# Patient Record
Sex: Male | Born: 1965
Health system: Southern US, Community
[De-identification: ages and names within clinical notes are randomized; demographics above are authoritative.]

## PROBLEM LIST (undated history)

## (undated) DIAGNOSIS — L219 Seborrheic dermatitis, unspecified: Secondary | ICD-10-CM

## (undated) DIAGNOSIS — E889 Metabolic disorder, unspecified: Secondary | ICD-10-CM

## (undated) DIAGNOSIS — K219 Gastro-esophageal reflux disease without esophagitis: Secondary | ICD-10-CM

## (undated) DIAGNOSIS — I82409 Acute embolism and thrombosis of unspecified deep veins of unspecified lower extremity: Secondary | ICD-10-CM

## (undated) DIAGNOSIS — N200 Calculus of kidney: Secondary | ICD-10-CM

## (undated) DIAGNOSIS — R42 Dizziness and giddiness: Secondary | ICD-10-CM

## (undated) DIAGNOSIS — I712 Thoracic aortic aneurysm, without rupture, unspecified: Secondary | ICD-10-CM

## (undated) DIAGNOSIS — R0602 Shortness of breath: Secondary | ICD-10-CM

## (undated) DIAGNOSIS — G479 Sleep disorder, unspecified: Secondary | ICD-10-CM

## (undated) DIAGNOSIS — I714 Abdominal aortic aneurysm, without rupture, unspecified: Secondary | ICD-10-CM

## (undated) DIAGNOSIS — K760 Fatty (change of) liver, not elsewhere classified: Secondary | ICD-10-CM

## (undated) DIAGNOSIS — I10 Essential (primary) hypertension: Secondary | ICD-10-CM

## (undated) DIAGNOSIS — E119 Type 2 diabetes mellitus without complications: Secondary | ICD-10-CM

## (undated) DIAGNOSIS — M549 Dorsalgia, unspecified: Secondary | ICD-10-CM

## (undated) DIAGNOSIS — N529 Male erectile dysfunction, unspecified: Secondary | ICD-10-CM

## (undated) HISTORY — DX: Shortness of breath: R06.02

## (undated) HISTORY — DX: Abdominal aortic aneurysm, without rupture: I71.4

## (undated) HISTORY — DX: Abdominal aortic aneurysm, without rupture, unspecified: I71.40

## (undated) HISTORY — DX: Dizziness and giddiness: R42

## (undated) HISTORY — DX: Gastro-esophageal reflux disease without esophagitis: K21.9

## (undated) HISTORY — DX: Acute embolism and thrombosis of unspecified deep veins of unspecified lower extremity: I82.409

## (undated) HISTORY — DX: Fatty (change of) liver, not elsewhere classified: K76.0

## (undated) HISTORY — DX: Metabolic disorder, unspecified: E88.9

## (undated) HISTORY — DX: Seborrheic dermatitis, unspecified: L21.9

## (undated) HISTORY — PX: DOPPLER ECHOCARDIOGRAPHY: SHX263

## (undated) HISTORY — DX: Male erectile dysfunction, unspecified: N52.9

## (undated) HISTORY — PX: OTHER SURGICAL HISTORY: SHX169

## (undated) HISTORY — DX: Thoracic aortic aneurysm, without rupture: I71.2

## (undated) HISTORY — PX: ANKLE SURGERY: SHX546

## (undated) HISTORY — DX: Thoracic aortic aneurysm, without rupture, unspecified: I71.20

---

## 1999-09-10 ENCOUNTER — Ambulatory Visit: Admission: RE | Admit: 1999-09-10 | Discharge: 1999-09-10 | Payer: Self-pay | Admitting: Family Medicine

## 2000-09-07 ENCOUNTER — Encounter: Payer: Self-pay | Admitting: Family Medicine

## 2000-09-07 ENCOUNTER — Encounter: Admission: RE | Admit: 2000-09-07 | Discharge: 2000-09-07 | Payer: Self-pay | Admitting: Family Medicine

## 2001-04-28 ENCOUNTER — Encounter: Payer: Self-pay | Admitting: Orthopedic Surgery

## 2001-04-28 ENCOUNTER — Ambulatory Visit (HOSPITAL_COMMUNITY): Admission: RE | Admit: 2001-04-28 | Discharge: 2001-04-28 | Payer: Self-pay | Admitting: Orthopedic Surgery

## 2001-07-26 ENCOUNTER — Encounter: Payer: Self-pay | Admitting: Urology

## 2001-07-26 ENCOUNTER — Encounter: Admission: RE | Admit: 2001-07-26 | Discharge: 2001-07-26 | Payer: Self-pay | Admitting: Urology

## 2001-07-27 ENCOUNTER — Encounter: Payer: Self-pay | Admitting: Urology

## 2001-07-27 ENCOUNTER — Ambulatory Visit (HOSPITAL_COMMUNITY): Admission: RE | Admit: 2001-07-27 | Discharge: 2001-07-27 | Payer: Self-pay | Admitting: Urology

## 2001-08-03 ENCOUNTER — Encounter: Payer: Self-pay | Admitting: Urology

## 2001-08-03 ENCOUNTER — Encounter: Admission: RE | Admit: 2001-08-03 | Discharge: 2001-08-03 | Payer: Self-pay | Admitting: Urology

## 2001-09-28 ENCOUNTER — Ambulatory Visit (HOSPITAL_COMMUNITY): Admission: RE | Admit: 2001-09-28 | Discharge: 2001-09-28 | Payer: Self-pay | Admitting: Gastroenterology

## 2003-05-25 ENCOUNTER — Emergency Department (HOSPITAL_COMMUNITY): Admission: EM | Admit: 2003-05-25 | Discharge: 2003-05-25 | Payer: Self-pay | Admitting: Emergency Medicine

## 2004-12-29 HISTORY — PX: OTHER SURGICAL HISTORY: SHX169

## 2005-01-20 HISTORY — PX: OTHER SURGICAL HISTORY: SHX169

## 2005-03-15 ENCOUNTER — Emergency Department (HOSPITAL_COMMUNITY): Admission: EM | Admit: 2005-03-15 | Discharge: 2005-03-15 | Payer: Self-pay | Admitting: *Deleted

## 2005-04-23 ENCOUNTER — Ambulatory Visit (HOSPITAL_COMMUNITY): Admission: RE | Admit: 2005-04-23 | Discharge: 2005-04-23 | Payer: Self-pay | Admitting: Urology

## 2005-04-23 ENCOUNTER — Emergency Department (HOSPITAL_COMMUNITY): Admission: EM | Admit: 2005-04-23 | Discharge: 2005-04-23 | Payer: Self-pay | Admitting: Emergency Medicine

## 2005-12-30 ENCOUNTER — Encounter: Payer: Self-pay | Admitting: Urology

## 2008-06-08 ENCOUNTER — Emergency Department (HOSPITAL_COMMUNITY): Admission: EM | Admit: 2008-06-08 | Discharge: 2008-06-08 | Payer: Self-pay | Admitting: Emergency Medicine

## 2008-06-13 HISTORY — PX: NM MYOCAR PERF EJECTION FRACTION: HXRAD630

## 2008-11-26 ENCOUNTER — Ambulatory Visit: Payer: Self-pay | Admitting: Vascular Surgery

## 2009-01-22 ENCOUNTER — Encounter: Admission: RE | Admit: 2009-01-22 | Discharge: 2009-01-22 | Payer: Self-pay | Admitting: Family Medicine

## 2009-01-29 ENCOUNTER — Encounter: Admission: RE | Admit: 2009-01-29 | Discharge: 2009-01-29 | Payer: Self-pay | Admitting: Family Medicine

## 2009-03-25 ENCOUNTER — Ambulatory Visit: Payer: Self-pay | Admitting: Vascular Surgery

## 2009-06-03 ENCOUNTER — Ambulatory Visit: Payer: Self-pay | Admitting: Vascular Surgery

## 2009-06-25 ENCOUNTER — Ambulatory Visit: Payer: Self-pay | Admitting: Vascular Surgery

## 2009-07-01 ENCOUNTER — Ambulatory Visit: Payer: Self-pay | Admitting: Vascular Surgery

## 2009-07-08 ENCOUNTER — Ambulatory Visit: Payer: Self-pay | Admitting: Vascular Surgery

## 2009-08-01 ENCOUNTER — Ambulatory Visit: Payer: Self-pay | Admitting: Vascular Surgery

## 2009-09-25 ENCOUNTER — Ambulatory Visit (HOSPITAL_BASED_OUTPATIENT_CLINIC_OR_DEPARTMENT_OTHER): Admission: RE | Admit: 2009-09-25 | Discharge: 2009-09-25 | Payer: Self-pay | Admitting: Orthopedic Surgery

## 2010-03-18 ENCOUNTER — Ambulatory Visit: Payer: Self-pay | Admitting: Vascular Surgery

## 2010-04-01 ENCOUNTER — Ambulatory Visit: Payer: Self-pay | Admitting: Vascular Surgery

## 2010-11-16 LAB — POCT HEMOGLOBIN-HEMACUE: Hemoglobin: 14.6 g/dL (ref 13.0–17.0)

## 2010-11-16 LAB — BASIC METABOLIC PANEL
CO2: 30 mEq/L (ref 19–32)
Calcium: 9.6 mg/dL (ref 8.4–10.5)
Chloride: 105 mEq/L (ref 96–112)
GFR calc Af Amer: >60 mL/min (ref >60)
GFR calc non Af Amer: >60 mL/min (ref >60)
Glucose, Bld: 134 mg/dL — ABNORMAL HIGH (ref 70–99)
Potassium: 4.4 mEq/L (ref 3.5–5.1)
Sodium: 140 mEq/L (ref 135–145)

## 2011-01-13 NOTE — Procedures (Signed)
LOWER EXTREMITY VENOUS REFLUX EXAM   INDICATION:  Bilateral lower extremity pain, swelling and varicosities.   EXAM:  Using color-flow imaging and pulse Doppler spectral analysis, the  bilateral common femoral, superficial femoral, popliteal, posterior  tibial, greater and lesser saphenous veins were evaluated.  There  is no  evidence suggesting deep venous insufficiency in the right lower  extremity.  The left demonstrates very mild deep venous insufficiency.   The bilateral saphenofemoral junctions are not competent with reflux  greater than 500 milliseconds. The bilateral great saphenous veins are  not competent with reflux of  greater than 500 milliseconds with the  caliber as described below.   The bilateral proximal short saphenous veins demonstrate competency.   GSV Diameter (used if found to be incompetent only)                                            Right    Left  Proximal Greater Saphenous Vein           1.34 cm  1.08 cm  Proximal-to-mid-thigh                     cm       0.69 cm  Mid thigh                                 0.49 cm  0.47 cm  Mid-distal thigh                          cm       cm  Distal thigh                              0.41 cm  0.39 cm  Knee                                      0.34 cm  0.45 cm   IMPRESSION:  1. Bilateral greater saphenous vein reflux is identified with the      caliber ranging from 0.34 cm to 1.34 cm knee to groin on the right      and 0.45 to 1.08 cm on the left.  2. The bilateral greater saphenous veins are not aneurysmal.  3. The bilateral greater saphenous veins are not tortuous.  4. The deep venous system is competent.        ___________________________________________  Quita Skye. Hart Rochester, M.D.   CJ/MEDQ  D:  06/25/2009  T:  06/26/2009  Job:  [id number]

## 2011-01-13 NOTE — Assessment & Plan Note (Signed)
OFFICE VISIT   Gordon Allen, Gordon Allen  DOB:  21-Apr-1966                                       03/25/2009  WGNFA#:[id number]   Patient returns today for further follow-up regarding his severe venous  insufficiency of the left leg.  He has been wearing long-leg elastic  compression stockings as well as elevating his legs, as much as his work  will permit, and taking ibuprofen and continues to have burning,  throbbing and aching discomfort as well as swelling in the distal leg as  the day progresses.  He works on his feet.  He is a Merchandiser, retail at [employer] and is unable to stop and elevate his legs frequently to relieve  his symptoms.   PHYSICAL EXAMINATION:  On examination, he does have a large nest of  prominent varicosities in the medial calf, caused by the venous  hypertension in his left great saphenous vein, which has refluxed at the  saphenofemoral junction and throughout.  The deep venous system is  unremarkable.   I feel that these symptoms are affecting his daily living and his  ability to work and that we should proceed with laser ablation of his  left great saphenous vein as an initial procedure to be followed by 2  courses of sclerotherapy.  I think this treatment plan will relieve his  symptomatology and improve his ability to work, and we will proceed with  this in the near future when we can achieve precertification.   Quita Skye Hart Rochester, M.D.  Electronically Signed   JDL/MEDQ  D:  03/25/2009  T:  03/26/2009  Job:  [id number]

## 2011-01-13 NOTE — Procedures (Signed)
LOWER EXTREMITY VENOUS REFLUX EXAM   INDICATION:  Left lower extremity varicose veins.   EXAM:  Using color-flow imaging and pulse Doppler spectral analysis, the  left common femoral, superficial femoral, popliteal, posterior tibial,  greater and lesser saphenous veins are evaluated.  There is no evidence  suggesting deep venous insufficiency in the left lower extremity.   The left saphenofemoral junction is not competent.  The left GSV is not  competent with the caliber as described below.   The right and the left proximal short saphenous vein demonstrates  competency.   GSV Diameter (used if found to be incompetent only)                                            Right    Left  Proximal Greater Saphenous Vein           cm       0.70 cm  Proximal-to-mid-thigh                     cm       0.62 cm  Mid thigh                                 cm       0.57 cm  Mid-distal thigh                          cm       0.53 cm  Distal thigh                              cm       0.46 cm  Knee                                      cm       0.49 cm   IMPRESSION:  1. Left greater saphenous vein reflux is identified with the caliber      ranging from 0.70 cm to 0.46 cm knee to groin.  2. The right and left greater saphenous veins are not aneurysmal.  3. The right and left greater saphenous veins are not tortuous.  4. The deep venous system is competent.  5. The right and left lesser saphenous veins are competent.   ___________________________________________  Quita Skye. Hart Rochester, M.D.   AC/MEDQ  D:  11/26/2008  T:  11/26/2008  Job:  [id number]

## 2011-01-13 NOTE — Assessment & Plan Note (Signed)
OFFICE VISIT   Gordon Allen, Gordon Allen  DOB:  [birth year]                                       06/03/2009  ZOXWR#:[id number]   The patient came today for laser ablation of his left great saphenous  vein for painful varicosities and venous insufficiency.  Ultrasound  study appeared that the greater saphenous was incompetent down to the  knee level.  I did enter the greater saphenous on 2 or 3 occasions at  the knee and distal thigh level but had inability to pass the guidewire  proximally.  I did insert the sheath which then caused some discomfort  after which I inserted more tumescent  anesthetic to relieve this.  The proximal greater saphenous vein then  became quite constricted.  I was unable to access the greater saphenous  vein in the proximal thigh.  I felt that the best plan would be wait 3  weeks, repeat a duplex scan of both the right and the left leg since he  is having some symptoms in the right leg as well.  We will look at a  reflux study at that point and see what our options are.  Return in 3  weeks for venous duplex of both legs and to see me.   Quita Skye Hart Rochester, M.D.  Electronically Signed   JDL/MEDQ  D:  06/03/2009  T:  06/04/2009  Job:  [id number]

## 2011-01-13 NOTE — Procedures (Signed)
DUPLEX ULTRASOUND OF ABDOMINAL AORTA   INDICATION:  History of abnormal ultrasound findings, family history of  AAA.   HISTORY:  Diabetes:  No  Cardiac:  No  Hypertension:  Yes  Smoking:  No  Family History:  Father had AAA.  Previous Surgery:  No   DUPLEX EXAM:         AP (cm)                   TRANSVERSE (cm)  Proximal             2.7 cm                    2.8 cm  Mid                  2.6 cm                    2.6 cm  Distal               2.5 cm                    2.7 cm  Right Iliac          1.5 cm                    1.5 cm  Left Iliac           1.5 cm                    1.5 cm   PREVIOUS:  Date:  AP:  TRANSVERSE:   IMPRESSION:  No evidence of aneurysm dilatation or elevated Doppler  velocities noted in the abdominal aorta and bilateral common iliac  arteries.   ___________________________________________  Quita Skye Hart Rochester, M.D.   CH/MEDQ  D:  04/01/2010  T:  04/01/2010  Job:  [id number]

## 2011-01-13 NOTE — Procedures (Signed)
DUPLEX DEEP VENOUS EXAM - LOWER EXTREMITY   INDICATION:  Post 1 week laser of the left great saphenous vein.   HISTORY:  Edema:  Yes.  Trauma/Surgery:  EVLT.  Pain:  Yes.  PE:  No.  Previous DVT:  No.  Anticoagulants:  No.  Other:   DUPLEX EXAM:                CFV   SFV   PopV  PTV    GSV                R  L  R  L  R  L  R   L  R  L  Thrombosis    o  o     o     o      o     +  Spontaneous   +  +     +     +      +     0  Phasic        +  +     +     +      +     0  Augmentation  +  +     +     +      +     0  Compressible  +  +     +     +      +     0  Competent     +  +     +     +      +     0   Legend:  + - yes  o - no  p - partial  D - decreased   IMPRESSION:  No evidence of deep venous thrombosis identified.  Left  great saphenous vein ablated from the saphenofemoral junction to the  distal thigh.    _____________________________  Quita Skye. Hart Rochester, M.D.   CJ/MEDQ  D:  07/08/2009  T:  07/08/2009  Job:  [id number]

## 2011-01-13 NOTE — Assessment & Plan Note (Signed)
OFFICE VISIT   Gordon Allen, Gordon Allen  DOB:  1966/06/23                                       06/25/2009  AVWUJ#:[id number]   The patient returns today for further evaluation after an attempt at  laser ablation on October 4 but was unable to get the guidewire to pass  proximally.  He had repeat venous duplex today and the great saphenous  vein continues to be widely patent with gross reflux up to the junction.  I do think this vein is accessible and if we are unable to cannulate it  percutaneously we can certainly approach it by cutdown.  He continues to  have the aching, throbbing, restless discomfort in his legs throughout  the day and he is on his feet at work despite wearing long-leg elastic  compression stockings.  Right leg has also become more symptomatic  particularly at night where he has a restless, heavy, aching feeling  which worsens during the day.  Today the venous study also shows reflux  on the right side in the great saphenous vein with normal deep system.   Will plan to proceed with laser ablation of his left great saphenous  vein next week and will follow this with sclerotherapy of the left side.  We will then see how the right leg symptoms progress over the next few  months and whether his symptomatology on the left leg is completely  relieved.   Quita Skye Hart Rochester, M.D.  Electronically Signed   JDL/MEDQ  D:  06/25/2009  T:  06/26/2009  Job:  [id number]

## 2011-01-13 NOTE — Assessment & Plan Note (Signed)
OFFICE VISIT   Gordon Allen, BLANK  DOB:  [birth year]                                       07/08/2009  ZOXWR#:[id number]   The patient returns today 1 week post laser ablation of his left great  saphenous vein with no phlebectomies performed.  He has had moderately  severe discomfort in the left thigh from the groin to the mid thigh  where the ablation was performed.  He has noticed significant bruising  in that area.  He has had no pain below the knee.  He has had  significant problems with the elastic compression stocking staying up,  and this is irritating the thigh.  He has taken his ibuprofen 3 tablets  3 times a day (200 mg) as instructed.  He denies any chest pain, dyspnea  on exertion, neurologic symptoms or other associated symptoms.  He has  had no distal edema in the left ankle.   PHYSICAL EXAMINATION:  Blood pressure 122/78, heart rate 78,  respirations 14.  Generally, he is alert and oriented x3.  Neck:  Supple, 3+ carotid pulses palpable.  Lower extremity exam reveals mild  tenderness along the course of the great saphenous vein from the  saphenofemoral junction to the mid thigh with mild to moderate bruising  which is resolving.  He has persistent varicosities in reticular veins  below the knee over the great saphenous system, which will be scheduled  for sclerotherapy in the near future.  He has no distal edema.   Venous duplex exam was ordered by me today and reviewed, and he has no  evidence of deep venous obstruction and total ablation of the left great  saphenous vein from the saphenofemoral junction to the distal thigh.  In  general, I think he is making reasonable progress.  He will discontinue  the stocking and wrap his thigh with an Ace, and we will schedule  sclerotherapy with Marisue Ivan in the near future.   Quita Skye Hart Rochester, M.D.  Electronically Signed   JDL/MEDQ  D:  07/08/2009  T:  07/09/2009  Job:  [id number]

## 2011-01-13 NOTE — Consult Note (Signed)
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  He first noticed these 3  to 4 years ago and they have become increasingly noticeable and  increasingly symptoms.  He has had no deep vein thrombosis,  thrombophlebitis, pulmonary embolism, or clotting problems, but does  have some mild swelling as the day progresses and the symptoms that were  aching and throbbing seem to worsen.  This is not relieved by elevation.  He does not take pain medication.  He does not wear elastic compression  stockings.   PAST MEDICAL HISTORY:  1. Hypertension.  2. Negative for diabetes, coronary artery disease, COPD, or stroke.   PREVIOUS SURGERIES:  Arthrodesis of the right ankle.   FAMILY HISTORY:  Positive for hypertension in mother and father.  Negative for coronary artery disease, diabetes, and stroke.   SOCIAL HISTORY:  He is single and works as a Merchandiser, retail at [employer].  Does not use tobacco and drinks occasional alcohol.   REVIEW OF SYSTEMS:  Negative for claudication.  No GI, GU, cardiac, or  pulmonary symptoms.   ALLERGIES:  None known.   MEDICATIONS:  Lisinopril 20 mg daily.   PHYSICAL EXAM:  Blood pressure 138/88, heart rate is 80, respirations  16.  Generally, he is a middle-aged male in no apparent distress.  Alert  and oriented x3.  Neck is supple, 2+ carotid pulses palpable.  No bruits  are audible.  Neurologic exam is normal.  No palpable adenopathy in the  neck.  Chest is clear to auscultation.  Cardiovascular exam is a regular  rhythm, no murmurs.  Abdomen soft and nontender with no masses.  He has  3+ femoral, popliteal, dorsalis pedis, and posterior tibial pulses  bilaterally.   Left leg has severe venous insufficiency below the knee  associated with the great saphenous system.  There is a prominent varix,  which feeds some reticular and spider veins in the mid calf posteriorly  associated with the great saphenous system.  There is mild edema in the  ankle distally.  No hyperpigmentation or ulceration is noted on the  left.  The right leg is free of varicosities.   Venous duplex exam revealed gross reflux in the entire left greater  saphenous vein from the saphenofemoral junction to the knee and even  more distally.  The deep system is normal.  Right leg is unremarkable.   He does have symptomatic venous insufficiency and we will treat him with  elastic compression stockings (20 mm - 30 mm gradient, long leg) as well  as elevation of the leg as much as his job will permit, and ibuprofen on  a regular basis for pain control.  He will return in 3 months to see if  he is having symptomatic relief, and if not, he would be a good  candidate for laser ablation of the left greater saphenous vein on the  first procedure and as a secondary procedure, 2 sessions of  sclerotherapy for these varicosities.   Quita Skye Hart Rochester, M.D.  Electronically Signed  JDL/MEDQ  D:  11/26/2008  T:  11/26/2008  Job:  [id number]   cc:   Windy Fast L. Earlene Plater, M.D.

## 2011-01-13 NOTE — Assessment & Plan Note (Signed)
OFFICE VISIT   HULBERT, BRANSCOME  DOB:  Nov 21, 1965                                       03/18/2010  ZOXWR#:[id number]   The patient came to the office today concerned about venous disease  because he has developed restless legs.  He had these symptoms prior  to his left leg ablation procedure which was performed in November 2010.  He has not developed any recurrent varicosities or had any aching,  throbbing, or burning discomfort or change in the edema of his left leg.  He states that at night his legs are constantly needing to be moved  around because of restlessness.  He also states that he is concerned  about an abdominal aortic aneurysm since his father had that and he did  have an ultrasound performed in Dr. Truett Perna office in 2009 which was  slightly abnormal.   PHYSICAL EXAMINATION:  On this exam today, his blood pressure 145/80,  heart rate 63, respirations 20.  His lower extremity exam reveals no  evidence of recurrent varicosities.  He has no edema in either lower  extremity.  He has 3+ femoral, popliteal, dorsalis pedis pulses, well-  perfused lower extremities and hyperpigmentation ulcerations noted.   I did not see any evidence of recurrent venous disease.  I discussed  this with the patient.  We will schedule him for an outpatient venous  duplex exam to get a baseline size of his aneurysm because of his family  history and followup will depend on the results of that study.     Quita Skye Hart Rochester, M.D.  Electronically Signed   JDL/MEDQ  D:  03/18/2010  T:  03/18/2010  Job:  [id number]

## 2011-06-02 LAB — DIFFERENTIAL
Basophils Relative: 0
Eosinophils Relative: 1
Lymphocytes Relative: 27
Lymphs Abs: 1.7
Monocytes Relative: 7

## 2011-06-02 LAB — CBC
HCT: 43.2
Hemoglobin: 14.6
MCV: 92
RDW: 12.9
WBC: 6.3

## 2011-06-02 LAB — POCT I-STAT, CHEM 8
BUN: 11
HCT: 43
Hemoglobin: 14.6

## 2012-12-15 ENCOUNTER — Encounter (HOSPITAL_COMMUNITY): Payer: Self-pay | Admitting: Emergency Medicine

## 2012-12-15 ENCOUNTER — Emergency Department (HOSPITAL_COMMUNITY)
Admission: EM | Admit: 2012-12-15 | Discharge: 2012-12-15 | Disposition: A | Discharge status: Home / Self Care | Payer: Managed Care, Other (non HMO) | Attending: Emergency Medicine | Admitting: Emergency Medicine

## 2012-12-15 ENCOUNTER — Ambulatory Visit
Admission: RE | Admit: 2012-12-15 | Discharge: 2012-12-15 | Disposition: A | Discharge status: Home / Self Care | Payer: Managed Care, Other (non HMO) | Source: Ambulatory Visit | Attending: Physician Assistant | Admitting: Physician Assistant

## 2012-12-15 ENCOUNTER — Other Ambulatory Visit: Payer: Self-pay | Admitting: Physician Assistant

## 2012-12-15 DIAGNOSIS — I82409 Acute embolism and thrombosis of unspecified deep veins of unspecified lower extremity: Secondary | ICD-10-CM | POA: Insufficient documentation

## 2012-12-15 DIAGNOSIS — Z8669 Personal history of other diseases of the nervous system and sense organs: Secondary | ICD-10-CM | POA: Insufficient documentation

## 2012-12-15 DIAGNOSIS — L740 Miliaria rubra: Secondary | ICD-10-CM

## 2012-12-15 DIAGNOSIS — M7989 Other specified soft tissue disorders: Secondary | ICD-10-CM | POA: Insufficient documentation

## 2012-12-15 DIAGNOSIS — I83891 Varicose veins of right lower extremities with other complications: Secondary | ICD-10-CM

## 2012-12-15 DIAGNOSIS — Z7901 Long term (current) use of anticoagulants: Secondary | ICD-10-CM | POA: Insufficient documentation

## 2012-12-15 DIAGNOSIS — I82401 Acute embolism and thrombosis of unspecified deep veins of right lower extremity: Secondary | ICD-10-CM

## 2012-12-15 DIAGNOSIS — I10 Essential (primary) hypertension: Secondary | ICD-10-CM | POA: Insufficient documentation

## 2012-12-15 DIAGNOSIS — E119 Type 2 diabetes mellitus without complications: Secondary | ICD-10-CM | POA: Insufficient documentation

## 2012-12-15 DIAGNOSIS — Z87442 Personal history of urinary calculi: Secondary | ICD-10-CM | POA: Insufficient documentation

## 2012-12-15 DIAGNOSIS — Z8739 Personal history of other diseases of the musculoskeletal system and connective tissue: Secondary | ICD-10-CM | POA: Insufficient documentation

## 2012-12-15 HISTORY — DX: Sleep disorder, unspecified: G47.9

## 2012-12-15 HISTORY — DX: Essential (primary) hypertension: I10

## 2012-12-15 HISTORY — DX: Type 2 diabetes mellitus without complications: E11.9

## 2012-12-15 HISTORY — DX: Calculus of kidney: N20.0

## 2012-12-15 HISTORY — DX: Dorsalgia, unspecified: M54.9

## 2012-12-15 MED ORDER — RIVAROXABAN 15 MG PO TABS: 15.0000 mg | ORAL_TABLET | Freq: Two times a day (BID) | ORAL | Status: DC

## 2012-12-15 MED ORDER — RIVAROXABAN 20 MG PO TABS: 20.0000 mg | ORAL_TABLET | Freq: Every day | ORAL | Status: DC

## 2012-12-15 NOTE — ED Notes (Signed)
Onset one week ago light pink rash right lower extremity increasing in size and color light pink to red. Pain right lower extremity pain worsening overtime. Pain currently 4/10 at rest. Outpatient ultrasound today positive for DVT RLE.

## 2012-12-16 ENCOUNTER — Telehealth: Payer: Self-pay | Admitting: Vascular Surgery

## 2012-12-16 NOTE — Telephone Encounter (Signed)
Pt called to discuss his recent DVT diagnosis.  Pt seen in ER yesterday with right leg DVT.  Started on Xarelto.  Still has pain in leg and swelling.  He has no shortness of breath.  Findings and treatment plan prescribed by the ER reviewed.  Pt reassured.  Advised to come to ER if shortness of breath or coughing or hemoptysis or if worsened swelling.  Avoid strenuous activities.  Fabienne Bruns, MD [facility] Office: [phone] Pager: [phone]

## 2012-12-19 ENCOUNTER — Telehealth: Payer: Self-pay | Admitting: *Deleted

## 2012-12-19 NOTE — Telephone Encounter (Signed)
Gordon Allen called to report that he will be followed by Dr. Clarene Duke in 4 weeks for bloodwork concerning his DVT. He understands the importance of following up on this; he was placed on Xarelto by the ED. Dr. Darrick Penna spoke to him by phone on 12-16-12.

## 2012-12-20 NOTE — ED Provider Notes (Signed)
History    46yM with RLE pain and swelling. Onset about a week ago. Progressively worsening. Denies trauma. NO fever or chills. Progressively worsening and worse with standing/ambulation. No hx of similar symptoms. Referred to ED after positive [place] for DVT. Denies CP, palpitations, SOB or lightheadedness. No hx of blood clot.  CSN: [id number]  Arrival date & time 12/15/12  1134   First MD Initiated Contact with Patient 12/15/12 1144      Chief Complaint  Patient presents with  . Leg Pain    (Consider location/radiation/quality/duration/timing/severity/associated sxs/prior treatment) HPI  Past Medical History  Diagnosis Date  . Back pain   . Hypertension   . Diabetes mellitus without complication   . Sleep difficulties   . Kidney stone     Past Surgical History  Procedure Laterality Date  . Ankle surgery      No family history on file.  History  Substance Use Topics  . Smoking status: Never Smoker   . Smokeless tobacco: Not on file  . Alcohol Use: No      Review of Systems  All systems reviewed and negative, other than as noted in HPI.   Allergies  Review of patient's allergies indicates no known allergies.  Home Medications   Current Outpatient Rx  Name  Route  Sig  Dispense  Refill  . Rivaroxaban (XARELTO) 15 MG TABS tablet   Oral   Take 1 tablet (15 mg total) by mouth 2 (two) times daily.   42 tablet   0     For 21 days then 20mg  daily.   . Rivaroxaban (XARELTO) 20 MG TABS   Oral   Take 1 tablet (20 mg total) by mouth daily.   60 tablet   0     Start on day 22.     BP 149/102  Pulse 86  Temp(Src) 98 F (36.7 C) (Oral)  Resp 18  Ht 5\' 11"  (1.803 m)  Wt 235 lb (106.595 kg)  BMI 32.79 kg/m2  SpO2 99%  Physical Exam  Nursing note and vitals reviewed. Constitutional: He appears well-developed and well-nourished. No distress.  HENT:  Head: Normocephalic and atraumatic.  Eyes: Conjunctivae are normal. Right eye exhibits no discharge.  Left eye exhibits no discharge.  Neck: Neck supple.  Cardiovascular: Normal rate, regular rhythm and normal heart sounds.  Exam reveals no gallop and no friction rub.   No murmur heard. Pulmonary/Chest: Effort normal and breath sounds normal. No respiratory distress.  Abdominal: Soft. He exhibits no distension. There is no tenderness.  Musculoskeletal: He exhibits no edema and no tenderness.  Flat splotchy erythematous rash to R shin. RLE swollen as compared to L. Calf tender. No cords palpated. Positive Homan's. Good DP and PT pulses.  Neurological: He is alert.  Skin: Skin is warm and dry.  Psychiatric: He has a normal mood and affect. His behavior is normal. Thought content normal.    ED Course  Procedures (including critical care time)  Labs Reviewed - No data to display No results found.   1. DVT (deep venous thrombosis), right       MDM  46yM with atraumatic RLE pain. [place] with evidence of DVT. No clinical evidence of PE. Discussed tx options. Pt requesting xarelto. Has "good" insurance. Information packer and scripts provided. Possible precipitated by recent care trip. Discussed the need to follow-up with PCP.        Raeford Razor, MD 12/20/12 [phone]

## 2012-12-22 ENCOUNTER — Emergency Department (HOSPITAL_COMMUNITY)
Admission: EM | Admit: 2012-12-22 | Discharge: 2012-12-22 | Disposition: A | Discharge status: Home / Self Care | Payer: Managed Care, Other (non HMO) | Attending: Emergency Medicine | Admitting: Emergency Medicine

## 2012-12-22 ENCOUNTER — Emergency Department (HOSPITAL_COMMUNITY): Payer: Managed Care, Other (non HMO)

## 2012-12-22 ENCOUNTER — Encounter (HOSPITAL_COMMUNITY): Payer: Self-pay | Admitting: Emergency Medicine

## 2012-12-22 DIAGNOSIS — I10 Essential (primary) hypertension: Secondary | ICD-10-CM | POA: Insufficient documentation

## 2012-12-22 DIAGNOSIS — IMO0001 Reserved for inherently not codable concepts without codable children: Secondary | ICD-10-CM | POA: Insufficient documentation

## 2012-12-22 DIAGNOSIS — R739 Hyperglycemia, unspecified: Secondary | ICD-10-CM

## 2012-12-22 DIAGNOSIS — R0609 Other forms of dyspnea: Secondary | ICD-10-CM

## 2012-12-22 DIAGNOSIS — Z87442 Personal history of urinary calculi: Secondary | ICD-10-CM | POA: Insufficient documentation

## 2012-12-22 DIAGNOSIS — R0600 Dyspnea, unspecified: Secondary | ICD-10-CM

## 2012-12-22 DIAGNOSIS — M7989 Other specified soft tissue disorders: Secondary | ICD-10-CM | POA: Insufficient documentation

## 2012-12-22 DIAGNOSIS — R42 Dizziness and giddiness: Secondary | ICD-10-CM | POA: Insufficient documentation

## 2012-12-22 DIAGNOSIS — Z8739 Personal history of other diseases of the musculoskeletal system and connective tissue: Secondary | ICD-10-CM | POA: Insufficient documentation

## 2012-12-22 DIAGNOSIS — Z79899 Other long term (current) drug therapy: Secondary | ICD-10-CM | POA: Insufficient documentation

## 2012-12-22 DIAGNOSIS — R109 Unspecified abdominal pain: Secondary | ICD-10-CM | POA: Insufficient documentation

## 2012-12-22 DIAGNOSIS — R0602 Shortness of breath: Secondary | ICD-10-CM | POA: Insufficient documentation

## 2012-12-22 DIAGNOSIS — E119 Type 2 diabetes mellitus without complications: Secondary | ICD-10-CM | POA: Insufficient documentation

## 2012-12-22 DIAGNOSIS — Z8669 Personal history of other diseases of the nervous system and sense organs: Secondary | ICD-10-CM | POA: Insufficient documentation

## 2012-12-22 HISTORY — DX: Acute embolism and thrombosis of unspecified deep veins of unspecified lower extremity: I82.409

## 2012-12-22 LAB — URINALYSIS, ROUTINE W REFLEX MICROSCOPIC
Hgb urine dipstick: NEGATIVE
Ketones, ur: NEGATIVE mg/dL
Specific Gravity, Urine: 1.024 (ref 1.005–1.030)
pH: 5.5 (ref 5.0–8.0)

## 2012-12-22 LAB — COMPREHENSIVE METABOLIC PANEL
Alkaline Phosphatase: 75 U/L (ref 39–117)
BUN: 11 mg/dL (ref 6–23)
CO2: 25 mEq/L (ref 19–32)
Chloride: 98 mEq/L (ref 96–112)
Creatinine, Ser: 0.8 mg/dL (ref 0.50–1.35)
GFR calc non Af Amer: >90 mL/min (ref >90)
Sodium: 137 mEq/L (ref 135–145)
Total Protein: 8.4 g/dL — ABNORMAL HIGH (ref 6.0–8.3)

## 2012-12-22 LAB — CBC WITH DIFFERENTIAL/PLATELET
Basophils Relative: 1 % (ref 0–1)
Eosinophils Absolute: 0 10*3/uL (ref 0.0–0.7)
HCT: 45 % (ref 39.0–52.0)
Hemoglobin: 15.7 g/dL (ref 13.0–17.0)
Lymphs Abs: 1.7 10*3/uL (ref 0.7–4.0)
MCH: 30.9 pg (ref 26.0–34.0)
MCV: 88.6 fL (ref 78.0–100.0)
Monocytes Absolute: 0.6 10*3/uL (ref 0.1–1.0)
Monocytes Relative: 7 % (ref 3–12)
Neutro Abs: 5.9 10*3/uL (ref 1.7–7.7)

## 2012-12-22 LAB — TROPONIN I: Troponin I: <0.3 ng/mL (ref <0.30)

## 2012-12-22 MED ORDER — SODIUM CHLORIDE 0.9 % IV BOLUS (SEPSIS)
1000.0000 mL | Freq: Once | INTRAVENOUS | Status: AC
  Administered 2012-12-22: 1000 mL via INTRAVENOUS

## 2012-12-22 MED ORDER — IOHEXOL 350 MG/ML SOLN
100.0000 mL | Freq: Once | INTRAVENOUS | Status: AC | PRN
  Administered 2012-12-22: 100 mL via INTRAVENOUS

## 2012-12-22 NOTE — ED Notes (Signed)
Patient given urinal and advised needed sample.

## 2012-12-22 NOTE — ED Provider Notes (Signed)
History     CSN: [id number]  Arrival date & time 12/22/12  1416   First MD Initiated Contact with Patient 12/22/12 1505      Chief Complaint  Patient presents with  . DVT    (Consider location/radiation/quality/duration/timing/severity/associated sxs/prior treatment) HPI Pt seen 1 week ago and diagnosed with RLE DVT. Started on xarelto. Pt states he has been compliant with meds. Has had > 1 week of increasing DOE. States he get winded climbing 1 flight of stairs. No dyspnea at rest. No fever, chills, chest or back pain. Pt states he drove to [place] from [place] in mid march. No recent surgeries. No family hx of PE/DVT.  Past Medical History  Diagnosis Date  . Back pain   . Hypertension   . Diabetes mellitus without complication   . Sleep difficulties   . Kidney stone   . DVT (deep venous thrombosis)     Past Surgical History  Procedure Laterality Date  . Ankle surgery      No family history on file.  History  Substance Use Topics  . Smoking status: Never Smoker   . Smokeless tobacco: Not on file  . Alcohol Use: No      Review of Systems  Constitutional: Negative for fever and chills.  Respiratory: Positive for shortness of breath. Negative for cough and wheezing.   Cardiovascular: Positive for leg swelling. Negative for chest pain and palpitations.  Gastrointestinal: Negative for nausea, vomiting and abdominal pain.  Musculoskeletal: Positive for myalgias. Negative for back pain.  Skin: Negative for wound.  Neurological: Positive for dizziness and light-headedness. Negative for syncope, weakness and numbness.  All other systems reviewed and are negative.    Allergies  Review of patient's allergies indicates no known allergies.  Home Medications   Current Outpatient Rx  Name  Route  Sig  Dispense  Refill  . diazepam (VALIUM) 5 MG tablet   Oral   Take 5 mg by mouth at bedtime.         [name] HYDROcodone-acetaminophen (NORCO) 10-325 MG per tablet   Oral  Take 1 tablet by mouth every 6 (six) hours as needed for pain.         [name] lisinopril-hydrochlorothiazide (PRINZIDE,ZESTORETIC) 20-12.5 MG per tablet   Oral   Take 1 tablet by mouth daily.         . metFORMIN (GLUCOPHAGE) 500 MG tablet   Oral   Take 500 mg by mouth 2 (two) times daily with a meal.         . polyethylene glycol (MIRALAX / GLYCOLAX) packet   Oral   Take 17 g by mouth daily as needed (for constipation).         . Rivaroxaban (XARELTO) 15 MG TABS tablet   Oral   Take 15 mg by mouth 2 (two) times daily. 21 day course of therapy started 12/15/12.           BP 131/96  Pulse 124  Temp(Src) 98.4 F (36.9 C) (Oral)  Resp 26  SpO2 97%  Physical Exam  Nursing note and vitals reviewed. Constitutional: He is oriented to person, place, and time. He appears well-developed and well-nourished. No distress.  HENT:  Head: Normocephalic and atraumatic.  Mouth/Throat: Oropharynx is clear and moist.  Eyes: EOM are normal. Pupils are equal, round, and reactive to light.  Neck: Normal range of motion. Neck supple.  Cardiovascular: Normal rate and regular rhythm.   Pulmonary/Chest: Effort normal and breath sounds normal. No respiratory distress.  He has no wheezes. He has no rales. He exhibits no tenderness.  Abdominal: Soft. Bowel sounds are normal. He exhibits no distension and no mass. There is no tenderness. There is no rebound and no guarding.  Musculoskeletal: Normal range of motion. He exhibits tenderness (R calf tenderness and firmness. Compression stocking in place. ). He exhibits no edema.  Neurological: He is alert and oriented to person, place, and time.  Moves all ext without deficit  Skin: Skin is warm and dry. No rash noted. No erythema.  Psychiatric: He has a normal mood and affect. His behavior is normal.    ED Course  Procedures (including critical care time)  Labs Reviewed  COMPREHENSIVE METABOLIC PANEL - Abnormal; Notable for the following:     Glucose, Bld 213 (*)    Total Protein 8.4 (*)    All other components within normal limits  PROTIME-INR - Abnormal; Notable for the following:    Prothrombin Time 19.0 (*)    INR 1.65 (*)    All other components within normal limits  URINALYSIS, ROUTINE W REFLEX MICROSCOPIC - Abnormal; Notable for the following:    Glucose, UA 100 (*)    All other components within normal limits  APTT - Abnormal; Notable for the following:    aPTT 58 (*)    All other components within normal limits  CBC WITH DIFFERENTIAL  TROPONIN I   Ct Angio Chest Pe W/cm &/or Wo Cm  12/22/2012  *RADIOLOGY REPORT*  Clinical Data: Shortness of breath.  Recent DVT.  CT ANGIOGRAPHY CHEST  Technique:  Multidetector CT imaging of the chest using the standard protocol during bolus administration of intravenous contrast. Multiplanar reconstructed images including MIPs were obtained and reviewed to evaluate the vascular anatomy.  Contrast: OMNIPAQUE IOHEXOL 350 MG/ML SOLN  Comparison: CT 08/10/2008  Findings: No filling defects in the pulmonary arteries to suggest pulmonary emboli. Heart is normal size. Aorta is normal caliber. No mediastinal, hilar, or axillary adenopathy.  Visualized thyroid and chest wall soft tissues unremarkable. Lungs are clear.  No focal airspace opacities or suspicious nodules.  No effusions. Imaging into the upper abdomen shows no acute findings.  No acute or focal bony abnormality.  IMPRESSION: No evidence of pulmonary embolus.  No acute findings.   Original Report Authenticated By: Charlett Nose, M.D.      1. Dyspnea on exertion   2. Hyperglycemia      Date: 12/22/2012  Rate:113  Rhythm: sinus tachycardia  QRS Axis: normal  Intervals: normal  ST/T Wave abnormalities: normal  Conduction Disutrbances:none  Narrative Interpretation:   Old EKG Reviewed: none available    MDM   HR improved with IVF's. Workup otherwise negative. Suspect dehydration and deconditioning cause for pt's symptoms.  Pt advised to f/u with PMD for ongoing symptoms and to return for worsening symptoms. Voiced understanding.        Loren Racer, MD 12/22/12 [phone]

## 2012-12-22 NOTE — Progress Notes (Signed)
Pt confirms pcp is kevin little [facility] updated

## 2012-12-22 NOTE — ED Notes (Signed)
Pt currently being treated for DVT to right leg. Pt now having increase in shortness of breath x 2 days.

## 2013-02-20 ENCOUNTER — Other Ambulatory Visit: Payer: Self-pay | Admitting: *Deleted

## 2013-02-20 DIAGNOSIS — I83893 Varicose veins of bilateral lower extremities with other complications: Secondary | ICD-10-CM

## 2013-02-22 ENCOUNTER — Other Ambulatory Visit: Payer: Self-pay

## 2013-02-22 ENCOUNTER — Encounter (INDEPENDENT_AMBULATORY_CARE_PROVIDER_SITE_OTHER): Payer: Managed Care, Other (non HMO) | Admitting: Vascular Surgery

## 2013-02-22 DIAGNOSIS — I83893 Varicose veins of bilateral lower extremities with other complications: Secondary | ICD-10-CM

## 2013-02-24 ENCOUNTER — Encounter: Payer: Self-pay | Admitting: Vascular Surgery

## 2013-02-27 ENCOUNTER — Ambulatory Visit (INDEPENDENT_AMBULATORY_CARE_PROVIDER_SITE_OTHER): Payer: Managed Care, Other (non HMO) | Admitting: Vascular Surgery

## 2013-02-27 ENCOUNTER — Encounter: Payer: Self-pay | Admitting: Vascular Surgery

## 2013-02-27 ENCOUNTER — Telehealth: Payer: Self-pay | Admitting: *Deleted

## 2013-02-27 VITALS — BP 151/91 | HR 56 | Resp 18 | Ht 71.0 in | Wt 235.4 lb

## 2013-02-27 DIAGNOSIS — I82401 Acute embolism and thrombosis of unspecified deep veins of right lower extremity: Secondary | ICD-10-CM

## 2013-02-27 DIAGNOSIS — I82409 Acute embolism and thrombosis of unspecified deep veins of unspecified lower extremity: Secondary | ICD-10-CM | POA: Insufficient documentation

## 2013-02-27 DIAGNOSIS — I83893 Varicose veins of bilateral lower extremities with other complications: Secondary | ICD-10-CM | POA: Insufficient documentation

## 2013-02-27 NOTE — Telephone Encounter (Signed)
Pt was seen today to review his duplex. He had some more questions about his DVT. I tried to explain what Dr. Hart Rochester had told him during the visit regarding his blood thinner, Aspirin, how to treat symptoms, what to look out for, etc. Pt was reassured.

## 2013-02-27 NOTE — Progress Notes (Signed)
Subjective:     Patient ID: Gordon Allen, male   DOB: 1965/09/06, 47 y.o.   MRN: [id number]  HPI this 47 year old male is known to me having undergone laser ablation of the left great saphenous vein and 2010 varicose veins. He's done well until April of this year when he developed a DVT in the right superficial femoral and popliteal veins. He has taken [place] since that time prescribed by Dr. Catha Gosselin. He denies any swelling in the leg but does state that his legs hurt after working. He is not wearing elastic compression stockings on a regular basis.   Past Medical History  Diagnosis Date  . Back pain   . Hypertension   . Diabetes mellitus without complication   . Sleep difficulties   . Kidney stone   . DVT (deep venous thrombosis)     History  Substance Use Topics  . Smoking status: Never Smoker   . Smokeless tobacco: Not on file  . Alcohol Use: No    No family history on file.  Allergies  Allergen Reactions  . Diclofenac Sodium     Increased gas  . Gabapentin     Severe diarrhea  . Tramadol     nausea    Current outpatient prescriptions:atorvastatin (LIPITOR) 20 MG tablet, Take 20 mg by mouth daily., Disp: , Rfl: ;  diazepam (VALIUM) 5 MG tablet, Take 5 mg by mouth at bedtime., Disp: , Rfl: ;  lisinopril-hydrochlorothiazide (PRINZIDE,ZESTORETIC) 20-12.5 MG per tablet, Take 1 tablet by mouth daily., Disp: , Rfl: ;  metFORMIN (GLUCOPHAGE) 500 MG tablet, Take 500 mg by mouth 2 (two) times daily with a meal., Disp: , Rfl:  pantoprazole (PROTONIX) 20 MG tablet, Take 20 mg by mouth 2 (two) times daily., Disp: , Rfl: ;  Pregabalin (LYRICA PO), Take by mouth daily., Disp: , Rfl: ;  Rivaroxaban (XARELTO) 15 MG TABS tablet, Take 20 mg by mouth daily. 21 day course of therapy started 12/15/12., Disp: , Rfl: ;  HYDROcodone-acetaminophen (NORCO) 10-325 MG per tablet, Take 1 tablet by mouth every 6 (six) hours as needed for pain., Disp: , Rfl:  polyethylene glycol (MIRALAX /  GLYCOLAX) packet, Take 17 g by mouth daily as needed (for constipation)., Disp: , Rfl:   BP 151/91  Pulse 56  Resp 18  Ht 5\' 11"  (1.803 m)  Wt 235 lb 6.4 oz (106.777 kg)  BMI 32.85 kg/m2  Body mass index is 32.85 kg/(m^2).           Review of Systems denies chest pain, dyspnea on exertion, PND, orthopnea, hemoptysis, claudication. Does complain of hip and knee pain. All other systems negative and complete review of systems    Objective:   Physical Exam blood pressure 151/91 heart rate 56 respirations 18 Gen.-alert and oriented x3 in no apparent distress HEENT normal for age Lungs no rhonchi or wheezing Cardiovascular regular rhythm no murmurs carotid pulses 3+ palpable no bruits audible Abdomen soft nontender no palpable masses Musculoskeletal free of  major deformities Skin clear -no rashes Neurologic normal Lower extremities 3+ femoral and dorsalis pedis pulses palpable bilaterally with no edema  Today I ordered a venous duplex exam of both legs which are reviewed and interpreted. There is a chronic DVT in the right leg involving the distal superficial femoral and popliteal veins with the rest of the right lower extremity venous system being patent. Left leg continues to have closure of the left great saphenous system and no DVT.  Assessment:     History of DVT the right leg superficial femoral and popliteal vein 2 months ago    Plan:     Would recommend continuing Xeralto for a total of 6 months and then switch to aspirin only -daily If patient has any edema he should be wearing bilateral lower extremity compression stockings and elevate the foot of the bed at night Further followup by Dr. Catha Gosselin

## 2013-03-21 ENCOUNTER — Ambulatory Visit: Payer: Managed Care, Other (non HMO) | Admitting: Vascular Surgery

## 2013-04-24 ENCOUNTER — Ambulatory Visit: Payer: Managed Care, Other (non HMO) | Admitting: Vascular Surgery

## 2013-05-30 DIAGNOSIS — D6859 Other primary thrombophilia: Secondary | ICD-10-CM | POA: Insufficient documentation

## 2013-07-10 ENCOUNTER — Other Ambulatory Visit: Payer: Self-pay | Admitting: *Deleted

## 2013-07-10 DIAGNOSIS — I803 Phlebitis and thrombophlebitis of lower extremities, unspecified: Secondary | ICD-10-CM

## 2013-07-31 ENCOUNTER — Encounter: Payer: Self-pay | Admitting: Vascular Surgery

## 2013-08-01 ENCOUNTER — Ambulatory Visit (INDEPENDENT_AMBULATORY_CARE_PROVIDER_SITE_OTHER): Payer: Managed Care, Other (non HMO) | Admitting: Vascular Surgery

## 2013-08-01 ENCOUNTER — Encounter: Payer: Self-pay | Admitting: Vascular Surgery

## 2013-08-01 ENCOUNTER — Encounter (HOSPITAL_COMMUNITY): Payer: Managed Care, Other (non HMO)

## 2013-08-01 ENCOUNTER — Ambulatory Visit (HOSPITAL_COMMUNITY)
Admission: RE | Admit: 2013-08-01 | Discharge: 2013-08-01 | Disposition: A | Discharge status: Home / Self Care | Payer: Managed Care, Other (non HMO) | Source: Ambulatory Visit | Attending: Vascular Surgery | Admitting: Vascular Surgery

## 2013-08-01 VITALS — BP 139/91 | HR 66 | Resp 16 | Ht 71.0 in | Wt 230.0 lb

## 2013-08-01 DIAGNOSIS — I82401 Acute embolism and thrombosis of unspecified deep veins of right lower extremity: Secondary | ICD-10-CM

## 2013-08-01 DIAGNOSIS — I82409 Acute embolism and thrombosis of unspecified deep veins of unspecified lower extremity: Secondary | ICD-10-CM

## 2013-08-01 DIAGNOSIS — I803 Phlebitis and thrombophlebitis of lower extremities, unspecified: Secondary | ICD-10-CM | POA: Insufficient documentation

## 2013-08-01 NOTE — Progress Notes (Signed)
Subjective:     Patient ID: Gordon Allen, male   DOB: [birth year], 47 y.o.   MRN: [id number]  HPI this 47 year old male returns today for followup regarding his chronic DVT in the right leg. He was found to have chronic occlusion in the distal superficial femoral vein and proximal popliteal vein 6 months ago. He also has reflux in the right great saphenous vein. He has been on Xeralto. He has been having occasional aching discomfort in the right leg and is concerned about more clots being present. He has no history of swelling in the left leg and has had previous laser ablation of the left great saphenous vein. He has a family history of abdominal aortic aneurysm-father-and was concerned about result of a duplex scan we did here in 2011. I related that information to him which revealed he has no evidence of abdominal aortic aneurysm.  Past Medical History  Diagnosis Date  . Back pain   . Hypertension   . Diabetes mellitus without complication   . Sleep difficulties   . Kidney stone   . DVT (deep venous thrombosis)     History  Substance Use Topics  . Smoking status: Never Smoker   . Smokeless tobacco: Not on file  . Alcohol Use: No    No family history on file.  Allergies  Allergen Reactions  . Diclofenac Sodium     Increased gas  . Gabapentin     Severe diarrhea  . Tramadol     nausea    Current outpatient prescriptions:atorvastatin (LIPITOR) 20 MG tablet, Take 20 mg by mouth daily., Disp: , Rfl: ;  diazepam (VALIUM) 5 MG tablet, Take 5 mg by mouth at bedtime., Disp: , Rfl: ;  HYDROcodone-acetaminophen (NORCO) 10-325 MG per tablet, Take 1 tablet by mouth every 6 (six) hours as needed for pain., Disp: , Rfl: ;  lisinopril-hydrochlorothiazide (PRINZIDE,ZESTORETIC) 20-12.5 MG per tablet, Take 1 tablet by mouth daily., Disp: , Rfl:  metFORMIN (GLUCOPHAGE) 500 MG tablet, Take 500 mg by mouth 2 (two) times daily with a meal., Disp: , Rfl: ;  pantoprazole (PROTONIX) 20 MG tablet,  Take 20 mg by mouth 2 (two) times daily., Disp: , Rfl: ;  polyethylene glycol (MIRALAX / GLYCOLAX) packet, Take 17 g by mouth daily as needed (for constipation)., Disp: , Rfl: ;  Pregabalin (LYRICA PO), Take by mouth daily., Disp: , Rfl:  Rivaroxaban (XARELTO) 15 MG TABS tablet, Take 20 mg by mouth daily. 21 day course of therapy started 12/15/12., Disp: , Rfl:   BP 139/91  Pulse 66  Resp 16  Ht 5\' 11"  (1.803 m)  Wt 230 lb (104.327 kg)  BMI 32.09 kg/m2  Body mass index is 32.09 kg/(m^2).          Review of Systems denies chest pain or dyspnea on exertion    Objective:   Physical Exam BP 139/91  Pulse 66  Resp 16  Ht 5\' 11"  (1.803 m)  Wt 230 lb (104.327 kg)  BMI 32.09 kg/m2  General well-developed well-nourished male no apparent stress alert and oriented x3 Lungs no rhonchi or wheezing Right leg with no significant distal edema. 3+ femoral and dorsalis pedis pulse palpable.  Today I ordered a duplex scan of the right leg which are reviewed and interpreted. There continues to be reflux in the right great saphenous vein. There is no new DVT. He does have chronic DVT in the mid to distal right superficial femoral and proximal popliteal vein which is unchanged.  Assessment:     Chronic DVT right leg unchanged from 6 months ago-currently on Xeralto Patient sees Dr. Shawnee Knapp at [facility] for clotting problems No evidence of abdominal aortic aneurysm on duplex scan in 20 oh 11     Plan:     Would begin taking daily aspirin only and discontinue Xeralto per Dr. Lacie Draft recommendation Return to see [place] on when necessary basis

## 2014-03-29 ENCOUNTER — Emergency Department (HOSPITAL_COMMUNITY)
Admission: EM | Admit: 2014-03-29 | Discharge: 2014-03-29 | Disposition: A | Discharge status: Home / Self Care | Payer: Worker's Compensation | Attending: Emergency Medicine | Admitting: Emergency Medicine

## 2014-03-29 ENCOUNTER — Encounter (HOSPITAL_COMMUNITY): Payer: Self-pay | Admitting: Emergency Medicine

## 2014-03-29 DIAGNOSIS — Z79899 Other long term (current) drug therapy: Secondary | ICD-10-CM | POA: Insufficient documentation

## 2014-03-29 DIAGNOSIS — S0191XA Laceration without foreign body of unspecified part of head, initial encounter: Secondary | ICD-10-CM

## 2014-03-29 DIAGNOSIS — Z791 Long term (current) use of non-steroidal anti-inflammatories (NSAID): Secondary | ICD-10-CM | POA: Insufficient documentation

## 2014-03-29 DIAGNOSIS — W208XXA Other cause of strike by thrown, projected or falling object, initial encounter: Secondary | ICD-10-CM | POA: Insufficient documentation

## 2014-03-29 DIAGNOSIS — Y9289 Other specified places as the place of occurrence of the external cause: Secondary | ICD-10-CM | POA: Insufficient documentation

## 2014-03-29 DIAGNOSIS — S0990XA Unspecified injury of head, initial encounter: Secondary | ICD-10-CM

## 2014-03-29 DIAGNOSIS — Y990 Civilian activity done for income or pay: Secondary | ICD-10-CM | POA: Insufficient documentation

## 2014-03-29 DIAGNOSIS — E119 Type 2 diabetes mellitus without complications: Secondary | ICD-10-CM | POA: Insufficient documentation

## 2014-03-29 DIAGNOSIS — Z87442 Personal history of urinary calculi: Secondary | ICD-10-CM | POA: Insufficient documentation

## 2014-03-29 DIAGNOSIS — S0100XA Unspecified open wound of scalp, initial encounter: Secondary | ICD-10-CM | POA: Insufficient documentation

## 2014-03-29 DIAGNOSIS — Z7982 Long term (current) use of aspirin: Secondary | ICD-10-CM | POA: Insufficient documentation

## 2014-03-29 DIAGNOSIS — I10 Essential (primary) hypertension: Secondary | ICD-10-CM | POA: Insufficient documentation

## 2014-03-29 DIAGNOSIS — Z86718 Personal history of other venous thrombosis and embolism: Secondary | ICD-10-CM | POA: Insufficient documentation

## 2014-03-29 MED ORDER — ACETAMINOPHEN 500 MG PO TABS
1000.0000 mg | ORAL_TABLET | Freq: Once | ORAL | Status: AC
  Administered 2014-03-29: 1000 mg via ORAL
  Filled 2014-03-29: qty 2

## 2014-03-29 NOTE — ED Notes (Signed)
Pt states while at work this am board fell from about 12-14 feet hitting pt in top of the head. Laceration noted. Bleeding controlled.

## 2014-03-29 NOTE — Discharge Instructions (Signed)
Keep wound clean and dry. Take tylenol or ibuprofen as needed for pain. Refer to attached documents for more information. Return to the ED with worsening or concerning symptoms. Follow up with your PCP in 1 week for staple removal or return to the ED for staple removal.

## 2014-03-29 NOTE — ED Provider Notes (Signed)
Medical screening examination/treatment/procedure(s) were performed by non-physician practitioner and as supervising physician I was immediately available for consultation/collaboration.   EKG Interpretation None       Kalman Drape, MD 03/29/14 [phone]

## 2014-03-29 NOTE — ED Notes (Signed)
Patient here for workman's comp and boss came to ED with patient Patient is very adamant that NO medical history or any issues r/t patient care be discussed with his boss Informed patient that his boss can wait out in the ED waiting room, that he did not have to have him present at bedside

## 2014-03-29 NOTE — ED Provider Notes (Signed)
CSN: [id number]     Arrival date & time 03/29/14  0529 History   First MD Initiated Contact with Patient 03/29/14 [phone]     Chief Complaint  Patient presents with  . Head Injury     (Consider location/radiation/quality/duration/timing/severity/associated sxs/prior Treatment) HPI Comments: Patient is a 48 year old male who presents from work with a head injury that occurred prior to arrival. Patient reports a wooden board falling 12-14 feet and hitting him on the top of the head. Patient reports immediate onset of throbbing and severe pain that started after the incident. Patient denies any LOC. He reports associated wound with copious bleeding. Patient held pressure on the wound to control bleeding. Patient denies any other symptoms or other injuries. No aggravating/alleviatin factors.    Past Medical History  Diagnosis Date  . Back pain   . Hypertension   . Diabetes mellitus without complication   . Sleep difficulties   . Kidney stone   . DVT (deep venous thrombosis)    Past Surgical History  Procedure Laterality Date  . Ankle surgery     No family history on file. History  Substance Use Topics  . Smoking status: Never Smoker   . Smokeless tobacco: Not on file  . Alcohol Use: No    Review of Systems  Constitutional: Negative for fever, chills and fatigue.  HENT: Negative for trouble swallowing.   Eyes: Negative for visual disturbance.  Respiratory: Negative for shortness of breath.   Cardiovascular: Negative for chest pain and palpitations.  Gastrointestinal: Negative for nausea, vomiting, abdominal pain and diarrhea.  Genitourinary: Negative for dysuria and difficulty urinating.  Musculoskeletal: Negative for arthralgias and neck pain.  Skin: Positive for wound. Negative for color change.  Neurological: Positive for headaches. Negative for dizziness and weakness.  Psychiatric/Behavioral: Negative for dysphoric mood.      Allergies  Diclofenac sodium; Gabapentin;  and Tramadol  Home Medications   Prior to Admission medications   Medication Sig Start Date End Date Taking? Authorizing Provider  aspirin EC 81 MG tablet Take 81 mg by mouth daily.   Yes Historical Provider, MD  atorvastatin (LIPITOR) 20 MG tablet Take 20 mg by mouth daily.   Yes Historical Provider, MD  celecoxib (CELEBREX) 200 MG capsule Take 200 mg by mouth 2 (two) times daily.   Yes Historical Provider, MD  lisinopril-hydrochlorothiazide (PRINZIDE,ZESTORETIC) 20-12.5 MG per tablet Take 1 tablet by mouth daily.   Yes Historical Provider, MD  metFORMIN (GLUCOPHAGE) 1000 MG tablet Take 1,000 mg by mouth 2 (two) times daily with a meal.   Yes Historical Provider, MD  oxymorphone (OPANA) 5 MG tablet Take 5 mg by mouth every 6 (six) hours as needed for pain.   Yes Historical Provider, MD  pantoprazole (PROTONIX) 20 MG tablet Take 20 mg by mouth daily.    Yes Historical Provider, MD  polyethylene glycol (MIRALAX / GLYCOLAX) packet Take 17 g by mouth daily as needed (for constipation).   Yes Historical Provider, MD   BP 158/93  Pulse 102  Temp(Src) 98 F (36.7 C) (Oral)  Resp 18  Ht 5\' 11"  (1.803 m)  Wt 230 lb (104.327 kg)  BMI 32.09 kg/m2  SpO2 100% Physical Exam  Nursing note and vitals reviewed. Constitutional: He is oriented to person, place, and time. He appears well-developed and well-nourished. No distress.  HENT:  Head: Normocephalic and atraumatic.  4cm laceration of parietal scalp with controlled bleeding with underlying hematoma. The area is tender to palpation.   Eyes:  Conjunctivae and EOM are normal. Pupils are equal, round, and reactive to light.  Neck: Normal range of motion.  Cardiovascular: Normal rate and regular rhythm.  Exam reveals no gallop and no friction rub.   No murmur heard. Pulmonary/Chest: Effort normal and breath sounds normal. He has no wheezes. He has no rales. He exhibits no tenderness.  Musculoskeletal: Normal range of motion.  Neurological: He is  alert and oriented to person, place, and time. Coordination normal.  Speech is goal-oriented. Moves limbs without ataxia.   Skin: Skin is warm and dry.  See HENT  Psychiatric: He has a normal mood and affect. His behavior is normal.    ED Course  Procedures (including critical care time) Labs Review Labs Reviewed - No data to display  LACERATION REPAIR Performed by: Alvina Chou Authorized by: Alvina Chou Consent: Verbal consent obtained. Risks and benefits: risks, benefits and alternatives were discussed Consent given by: patient Patient identity confirmed: provided demographic data Prepped and Draped in normal sterile fashion Wound explored  Laceration Location: parietal scalp  Laceration Length: 4 cm  No Foreign Bodies seen or palpated  Anesthesia: local infiltration  Local anesthetic: lidocaine 1% with epinephrine  Anesthetic total: 2 ml  Irrigation method: syringe Amount of cleaning: standard  Skin closure: staples  Number of staples: 3  Technique: n/a  Patient tolerance: Patient tolerated the procedure well with no immediate complications.   Imaging Review No results found.   EKG Interpretation None      MDM   Final diagnoses:  Laceration of head, initial encounter  Head injury, initial encounter    7:06 AM Patient's laceration repaired without difficulty. Vitals stable and patient afebrile. Patient does not need a head CT at this time. Patient had no LOC or skull fracture noted clinically. Patient has no neuro deficits. Patient instructed to be aware of any worsening or concerning symptoms and to return to the ED if needed. Patient instructed to follow up with PCP in 5-7 days for staple removal.    Alvina Chou, PA-C 03/29/14 [phone]

## 2014-03-29 NOTE — ED Notes (Signed)
ED PA at bedside

## 2014-04-05 ENCOUNTER — Encounter (HOSPITAL_COMMUNITY): Payer: Self-pay | Admitting: Emergency Medicine

## 2014-04-05 ENCOUNTER — Emergency Department (HOSPITAL_COMMUNITY)
Admission: EM | Admit: 2014-04-05 | Discharge: 2014-04-05 | Disposition: A | Discharge status: Home / Self Care | Payer: Worker's Compensation | Attending: Emergency Medicine | Admitting: Emergency Medicine

## 2014-04-05 ENCOUNTER — Emergency Department (HOSPITAL_COMMUNITY): Payer: Worker's Compensation

## 2014-04-05 DIAGNOSIS — E119 Type 2 diabetes mellitus without complications: Secondary | ICD-10-CM | POA: Insufficient documentation

## 2014-04-05 DIAGNOSIS — Z86718 Personal history of other venous thrombosis and embolism: Secondary | ICD-10-CM | POA: Insufficient documentation

## 2014-04-05 DIAGNOSIS — Z87442 Personal history of urinary calculi: Secondary | ICD-10-CM | POA: Insufficient documentation

## 2014-04-05 DIAGNOSIS — F0781 Postconcussional syndrome: Secondary | ICD-10-CM | POA: Insufficient documentation

## 2014-04-05 DIAGNOSIS — R42 Dizziness and giddiness: Secondary | ICD-10-CM | POA: Insufficient documentation

## 2014-04-05 DIAGNOSIS — Z4802 Encounter for removal of sutures: Secondary | ICD-10-CM | POA: Insufficient documentation

## 2014-04-05 DIAGNOSIS — I10 Essential (primary) hypertension: Secondary | ICD-10-CM | POA: Insufficient documentation

## 2014-04-05 DIAGNOSIS — R51 Headache: Secondary | ICD-10-CM | POA: Insufficient documentation

## 2014-04-05 DIAGNOSIS — Z79899 Other long term (current) drug therapy: Secondary | ICD-10-CM | POA: Insufficient documentation

## 2014-04-05 DIAGNOSIS — Z7982 Long term (current) use of aspirin: Secondary | ICD-10-CM | POA: Insufficient documentation

## 2014-04-05 MED ORDER — ACETAMINOPHEN 500 MG PO TABS: 500.0000 mg | ORAL_TABLET | Freq: Four times a day (QID) | ORAL | Status: DC | PRN

## 2014-04-05 NOTE — Discharge Instructions (Signed)
Concussion  A concussion, or closed-head injury, is a brain injury caused by a direct blow to the head or by a quick and sudden movement (jolt) of the head or neck. Concussions are usually not life-threatening. Even so, the effects of a concussion can be serious. If you have had a concussion before, you are more likely to experience concussion-like symptoms after a direct blow to the head.   CAUSES  · Direct blow to the head, such as from running into another player during a soccer game, being hit in a fight, or hitting your head on a hard surface.  · A jolt of the head or neck that causes the brain to move back and forth inside the skull, such as in a car crash.  SIGNS AND SYMPTOMS  The signs of a concussion can be hard to notice. Early on, they may be missed by you, family members, and health care providers. You may look fine but act or feel differently.  Symptoms are usually temporary, but they may last for days, weeks, or even longer. Some symptoms may appear right away while others may not show up for hours or days. Every head injury is different. Symptoms include:  · Mild to moderate headaches that will not go away.  · A feeling of pressure inside your head.  · Having more trouble than usual:  ¨ Learning or remembering things you have heard.  ¨ Answering questions.  ¨ Paying attention or concentrating.  ¨ Organizing daily tasks.  ¨ Making decisions and solving problems.  · Slowness in thinking, acting or reacting, speaking, or reading.  · Getting lost or being easily confused.  · Feeling tired all the time or lacking energy (fatigued).  · Feeling drowsy.  · Sleep disturbances.  ¨ Sleeping more than usual.  ¨ Sleeping less than usual.  ¨ Trouble falling asleep.  ¨ Trouble sleeping (insomnia).  · Loss of balance or feeling lightheaded or dizzy.  · Nausea or vomiting.  · Numbness or tingling.  · Increased sensitivity to:  ¨ Sounds.  ¨ Lights.  ¨ Distractions.  · Vision problems or eyes that tire  easily.  · Diminished sense of taste or smell.  · Ringing in the ears.  · Mood changes such as feeling sad or anxious.  · Becoming easily irritated or angry for little or no reason.  · Lack of motivation.  · Seeing or hearing things other people do not see or hear (hallucinations).  DIAGNOSIS  Your health care provider can usually diagnose a concussion based on a description of your injury and symptoms. He or she will ask whether you passed out (lost consciousness) and whether you are having trouble remembering events that happened right before and during your injury.  Your evaluation might include:  · A brain scan to look for signs of injury to the brain. Even if the test shows no injury, you may still have a concussion.  · Blood tests to be sure other problems are not present.  TREATMENT  · Concussions are usually treated in an emergency department, in urgent care, or at a clinic. You may need to stay in the hospital overnight for further treatment.  · Tell your health care provider if you are taking any medicines, including prescription medicines, over-the-counter medicines, and natural remedies. Some medicines, such as blood thinners (anticoagulants) and aspirin, may increase the chance of complications. Also tell your health care provider whether you have had alcohol or are taking illegal drugs. This information   may affect treatment.  · Your health care provider will send you home with important instructions to follow.  · How fast you will recover from a concussion depends on many factors. These factors include how severe your concussion is, what part of your brain was injured, your age, and how healthy you were before the concussion.  · Most people with mild injuries recover fully. Recovery can take time. In general, recovery is slower in older persons. Also, persons who have had a concussion in the past or have other medical problems may find that it takes longer to recover from their current injury.  HOME  CARE INSTRUCTIONS  General Instructions  · Carefully follow the directions your health care provider gave you.  · Only take over-the-counter or prescription medicines for pain, discomfort, or fever as directed by your health care provider.  · Take only those medicines that your health care provider has approved.  · Do not drink alcohol until your health care provider says you are well enough to do so. Alcohol and certain other drugs may slow your recovery and can put you at risk of further injury.  · If it is harder than usual to remember things, write them down.  · If you are easily distracted, try to do one thing at a time. For example, do not try to watch TV while fixing dinner.  · Talk with family members or close friends when making important decisions.  · Keep all follow-up appointments. Repeated evaluation of your symptoms is recommended for your recovery.  · Watch your symptoms and tell others to do the same. Complications sometimes occur after a concussion. Older adults with a brain injury may have a higher risk of serious complications, such as a blood clot on the brain.  · Tell your teachers, school nurse, school counselor, coach, athletic trainer, or work manager about your injury, symptoms, and restrictions. Tell them about what you can or cannot do. They should watch for:  ¨ Increased problems with attention or concentration.  ¨ Increased difficulty remembering or learning new information.  ¨ Increased time needed to complete tasks or assignments.  ¨ Increased irritability or decreased ability to cope with stress.  ¨ Increased symptoms.  · Rest. Rest helps the brain to heal. Make sure you:  ¨ Get plenty of sleep at night. Avoid staying up late at night.  ¨ Keep the same bedtime hours on weekends and weekdays.  ¨ Rest during the day. Take daytime naps or rest breaks when you feel tired.  · Limit activities that require a lot of thought or concentration. These include:  ¨ Doing homework or job-related  work.  ¨ Watching TV.  ¨ Working on the computer.  · Avoid any situation where there is potential for another head injury (football, hockey, soccer, basketball, martial arts, downhill snow sports and horseback riding). Your condition will get worse every time you experience a concussion. You should avoid these activities until you are evaluated by the appropriate follow-up health care providers.  Returning To Your Regular Activities  You will need to return to your normal activities slowly, not all at once. You must give your body and brain enough time for recovery.  · Do not return to sports or other athletic activities until your health care provider tells you it is safe to do so.  · Ask your health care provider when you can drive, ride a bicycle, or operate heavy machinery. Your ability to react may be slower after a   brain injury. Never do these activities if you are dizzy.  · Ask your health care provider about when you can return to work or school.  Preventing Another Concussion  It is very important to avoid another brain injury, especially before you have recovered. In rare cases, another injury can lead to permanent brain damage, brain swelling, or death. The risk of this is greatest during the first 7-10 days after a head injury. Avoid injuries by:  · Wearing a seat belt when riding in a car.  · Drinking alcohol only in moderation.  · Wearing a helmet when biking, skiing, skateboarding, skating, or doing similar activities.  · Avoiding activities that could lead to a second concussion, such as contact or recreational sports, until your health care provider says it is okay.  · Taking safety measures in your home.  ¨ Remove clutter and tripping hazards from floors and stairways.  ¨ Use grab bars in bathrooms and handrails by stairs.  ¨ Place non-slip mats on floors and in bathtubs.  ¨ Improve lighting in dim areas.  SEEK MEDICAL CARE IF:  · You have increased problems paying attention or  concentrating.  · You have increased difficulty remembering or learning new information.  · You need more time to complete tasks or assignments than before.  · You have increased irritability or decreased ability to cope with stress.  · You have more symptoms than before.  Seek medical care if you have any of the following symptoms for more than 2 weeks after your injury:  · Lasting (chronic) headaches.  · Dizziness or balance problems.  · Nausea.  · Vision problems.  · Increased sensitivity to noise or light.  · Depression or mood swings.  · Anxiety or irritability.  · Memory problems.  · Difficulty concentrating or paying attention.  · Sleep problems.  · Feeling tired all the time.  SEEK IMMEDIATE MEDICAL CARE IF:  · You have severe or worsening headaches. These may be a sign of a blood clot in the brain.  · You have weakness (even if only in one hand, leg, or part of the face).  · You have numbness.  · You have decreased coordination.  · You vomit repeatedly.  · You have increased sleepiness.  · One pupil is larger than the other.  · You have convulsions.  · You have slurred speech.  · You have increased confusion. This may be a sign of a blood clot in the brain.  · You have increased restlessness, agitation, or irritability.  · You are unable to recognize people or places.  · You have neck pain.  · It is difficult to wake you up.  · You have unusual behavior changes.  · You lose consciousness.  MAKE SURE YOU:  · Understand these instructions.  · Will watch your condition.  · Will get help right away if you are not doing well or get worse.  Document Released: 11/07/2003 Document Revised: 08/22/2013 Document Reviewed: 03/09/2013  [employer]® Patient Information ©2015 [employer]. This information is not intended to replace advice given to you by your health care provider. Make sure you discuss any questions you have with your health care provider.

## 2014-04-05 NOTE — ED Notes (Signed)
Pt was here Thursday being treated for head trauma, he received staples, its a workers comp case and needs documentation about his exam on Thursday. He complains of headaches and dizziness since the accident. When he lays down he gets nauseated. He also states that the insurance company that [employer] deals with says that his discharge instructions aren't clear enough

## 2014-04-05 NOTE — ED Provider Notes (Signed)
CSN: [id number]     Arrival date & time 04/05/14  2000 History   First MD Initiated Contact with Patient 04/05/14 2129     Chief Complaint  Patient presents with  . Headache     (Consider location/radiation/quality/duration/timing/severity/associated sxs/prior Treatment) HPI  This 64 are old with history of back pain, hypertension, DVT who presents with headache and dizziness. Patient reports he was hit in head by 2 x 6 one week he had at work. He sustained a laceration to the head. He was seen and evaluated and his laceration was repaired. Since that time he reports intermittent headache which is sharp and bitemporal. Currently he is pain-free. He denies any vision changes, weakness, or numbness. He does report dizziness which feels like "being off balance." He states that he tried to followup with a provider that was bleeding to his Workmen's Comp. but because he is still having symptoms he was referred to the ER. He denies any injury. He denies any other symptoms. Does take a baby aspirin daily.  At baseline, patient does Opana and Celebrex for chronic pain.  Past Medical History  Diagnosis Date  . Back pain   . Hypertension   . Diabetes mellitus without complication   . Sleep difficulties   . Kidney stone   . DVT (deep venous thrombosis)    Past Surgical History  Procedure Laterality Date  . Ankle surgery     History reviewed. No pertinent family history. History  Substance Use Topics  . Smoking status: Never Smoker   . Smokeless tobacco: Not on file  . Alcohol Use: No    Review of Systems  Constitutional: Negative.  Negative for fever.  Eyes: Negative for photophobia and visual disturbance.  Respiratory: Negative.  Negative for chest tightness and shortness of breath.   Cardiovascular: Negative.  Negative for chest pain.  Gastrointestinal: Negative.  Negative for nausea and abdominal pain.  Genitourinary: Negative.  Negative for dysuria.  Musculoskeletal: Negative for  neck pain.  Neurological: Positive for dizziness, light-headedness and headaches.  All other systems reviewed and are negative.     Allergies  Diclofenac sodium; Gabapentin; and Tramadol  Home Medications   Prior to Admission medications   Medication Sig Start Date End Date Taking? Authorizing Provider  aspirin EC 81 MG tablet Take 81 mg by mouth daily.   Yes Historical Provider, MD  atorvastatin (LIPITOR) 20 MG tablet Take 20 mg by mouth daily.   Yes Historical Provider, MD  lisinopril-hydrochlorothiazide (PRINZIDE,ZESTORETIC) 20-12.5 MG per tablet Take 1 tablet by mouth daily.   Yes Historical Provider, MD  metFORMIN (GLUCOPHAGE) 1000 MG tablet Take 1,000 mg by mouth 2 (two) times daily with a meal.   Yes Historical Provider, MD  oxymorphone (OPANA) 5 MG tablet Take 5 mg by mouth every 6 (six) hours as needed for pain.   Yes Historical Provider, MD  pantoprazole (PROTONIX) 20 MG tablet Take 20 mg by mouth daily.    Yes Historical Provider, MD  polyethylene glycol (MIRALAX / GLYCOLAX) packet Take 17 g by mouth daily as needed (for constipation).   Yes Historical Provider, MD  acetaminophen (TYLENOL) 500 MG tablet Take 1 tablet (500 mg total) by mouth every 6 (six) hours as needed. 04/05/14   Merryl Hacker, MD   BP 119/84  Pulse 58  Temp(Src) 98.1 F (36.7 C) (Oral)  Resp 16  Ht 5\' 11"  (1.803 m)  Wt 232 lb (105.235 kg)  BMI 32.37 kg/m2  SpO2 99% Physical Exam  Nursing note and vitals reviewed. Constitutional: He is oriented to person, place, and time. He appears well-developed and well-nourished. No distress.  HENT:  Head: Normocephalic and atraumatic.  Eyes: Pupils are equal, round, and reactive to light.  Neck: Neck supple.  Cardiovascular: Normal rate, regular rhythm and normal heart sounds.   No murmur heard. Pulmonary/Chest: Effort normal and breath sounds normal. No respiratory distress. He has no wheezes.  Abdominal: Soft. Bowel sounds are normal. There is no  tenderness. There is no rebound.  Musculoskeletal: He exhibits no edema.  Lymphadenopathy:    He has no cervical adenopathy.  Neurological: He is alert and oriented to person, place, and time. No cranial nerve deficit.  5 out of 5 strength in all 4 extremities, normal gait  Skin: Skin is warm and dry.  Psychiatric: He has a normal mood and affect.    ED Course  Procedures (including critical care time) Labs Review Labs Reviewed - No data to display  Imaging Review Ct Head Wo Contrast  04/05/2014   CLINICAL DATA:  Head trauma, headache and dizziness after accident.  EXAM: CT HEAD WITHOUT CONTRAST  TECHNIQUE: Contiguous axial images were obtained from the base of the skull through the vertex without intravenous contrast.  COMPARISON:  CT of the head of report dated September 07, 2000 though images are not available for direct comparison.  FINDINGS: The ventricles and sulci are normal. No intraparenchymal hemorrhage, mass effect nor midline shift. No acute large vascular territory infarcts.  No abnormal extra-axial fluid collections. Basal cisterns are patent.  No skull fracture. The included ocular globes and orbital contents are non-suspicious. The mastoid aircells and included paranasal sinuses are well-aerated. Mild temporomandibular osteoarthrosis.  IMPRESSION: No acute intracranial process.   Electronically Signed   By: Elon Alas   On: 04/05/2014 22:31     EKG Interpretation None      MDM   Final diagnoses:  Concussion syndrome  Removal of staples   Patient presents with headache and dizziness. Recent head trauma resulting in head laceration. He is nontoxic and nonfocal on exam. Trauma was sustained while at work. History is is concerning for postconcussive syndrome. However, given ongoing symptoms and trauma, will obtain head CT to rule out subacute subdural. Patient is already on narcotic and anti-inflammatory medication.  Currently he is pain-free. CT scan is negative.  Discuss with patient concussion treatment including decreasing stimulus, resting, and not return to work until cleared by his primary physician. Patient stated understanding. I have added Tylenol to his pain regimen. Patient was given strict return precautions.  After history, exam, and medical workup I feel the patient has been appropriately medically screened and is safe for discharge home. Pertinent diagnoses were discussed with the patient. Patient was given return precautions.   Merryl Hacker, MD 04/05/14 [phone]

## 2014-07-16 DIAGNOSIS — M5030 Other cervical disc degeneration, unspecified cervical region: Secondary | ICD-10-CM | POA: Insufficient documentation

## 2014-07-16 DIAGNOSIS — M4802 Spinal stenosis, cervical region: Secondary | ICD-10-CM | POA: Insufficient documentation

## 2014-07-16 DIAGNOSIS — S060XAA Concussion with loss of consciousness status unknown, initial encounter: Secondary | ICD-10-CM | POA: Insufficient documentation

## 2014-07-16 DIAGNOSIS — M542 Cervicalgia: Secondary | ICD-10-CM | POA: Insufficient documentation

## 2015-01-22 ENCOUNTER — Other Ambulatory Visit: Payer: Self-pay | Admitting: Otolaryngology

## 2015-01-22 ENCOUNTER — Ambulatory Visit
Admission: RE | Admit: 2015-01-22 | Discharge: 2015-01-22 | Disposition: A | Discharge status: Home / Self Care | Payer: BLUE CROSS/BLUE SHIELD | Source: Ambulatory Visit | Attending: Otolaryngology | Admitting: Otolaryngology

## 2015-01-22 DIAGNOSIS — R059 Cough, unspecified: Secondary | ICD-10-CM

## 2015-01-22 DIAGNOSIS — R062 Wheezing: Secondary | ICD-10-CM

## 2015-01-22 DIAGNOSIS — R05 Cough: Secondary | ICD-10-CM

## 2015-01-22 DIAGNOSIS — R0602 Shortness of breath: Secondary | ICD-10-CM

## 2015-02-05 ENCOUNTER — Encounter (HOSPITAL_BASED_OUTPATIENT_CLINIC_OR_DEPARTMENT_OTHER): Payer: Self-pay | Admitting: *Deleted

## 2015-02-05 ENCOUNTER — Emergency Department (HOSPITAL_BASED_OUTPATIENT_CLINIC_OR_DEPARTMENT_OTHER)
Admission: EM | Admit: 2015-02-05 | Discharge: 2015-02-06 | Disposition: A | Discharge status: Home / Self Care | Payer: BLUE CROSS/BLUE SHIELD | Attending: Emergency Medicine | Admitting: Emergency Medicine

## 2015-02-05 ENCOUNTER — Emergency Department (HOSPITAL_BASED_OUTPATIENT_CLINIC_OR_DEPARTMENT_OTHER): Payer: BLUE CROSS/BLUE SHIELD

## 2015-02-05 DIAGNOSIS — Z87442 Personal history of urinary calculi: Secondary | ICD-10-CM | POA: Diagnosis not present

## 2015-02-05 DIAGNOSIS — Z79899 Other long term (current) drug therapy: Secondary | ICD-10-CM | POA: Insufficient documentation

## 2015-02-05 DIAGNOSIS — Z86718 Personal history of other venous thrombosis and embolism: Secondary | ICD-10-CM | POA: Insufficient documentation

## 2015-02-05 DIAGNOSIS — R0600 Dyspnea, unspecified: Secondary | ICD-10-CM | POA: Insufficient documentation

## 2015-02-05 DIAGNOSIS — E119 Type 2 diabetes mellitus without complications: Secondary | ICD-10-CM | POA: Insufficient documentation

## 2015-02-05 DIAGNOSIS — I10 Essential (primary) hypertension: Secondary | ICD-10-CM | POA: Diagnosis not present

## 2015-02-05 DIAGNOSIS — R7989 Other specified abnormal findings of blood chemistry: Secondary | ICD-10-CM

## 2015-02-05 DIAGNOSIS — R0602 Shortness of breath: Secondary | ICD-10-CM

## 2015-02-05 DIAGNOSIS — Z7982 Long term (current) use of aspirin: Secondary | ICD-10-CM | POA: Insufficient documentation

## 2015-02-05 LAB — BASIC METABOLIC PANEL
ANION GAP: 5 (ref 5–15)
BUN: 13 mg/dL (ref 6–20)
CO2: 24 mmol/L (ref 22–32)
CREATININE: 0.87 mg/dL (ref 0.61–1.24)
Calcium: 8.7 mg/dL — ABNORMAL LOW (ref 8.9–10.3)
Chloride: 107 mmol/L (ref 101–111)
GFR calc non Af Amer: >60 mL/min (ref >60)
GLUCOSE: 126 mg/dL — AB (ref 65–99)
Potassium: 3.8 mmol/L (ref 3.5–5.1)
SODIUM: 136 mmol/L (ref 135–145)

## 2015-02-05 LAB — CBC WITH DIFFERENTIAL/PLATELET
BASOS ABS: 0 10*3/uL (ref 0.0–0.1)
Basophils Relative: 0 % (ref 0–1)
EOS PCT: 3 % (ref 0–5)
Eosinophils Absolute: 0.2 10*3/uL (ref 0.0–0.7)
HCT: 40.3 % (ref 39.0–52.0)
Hemoglobin: 13.1 g/dL (ref 13.0–17.0)
LYMPHS PCT: 37 % (ref 12–46)
Lymphs Abs: 2.4 10*3/uL (ref 0.7–4.0)
MCH: 29.2 pg (ref 26.0–34.0)
MCHC: 32.5 g/dL (ref 30.0–36.0)
MCV: 90 fL (ref 78.0–100.0)
MONO ABS: 0.5 10*3/uL (ref 0.1–1.0)
MONOS PCT: 8 % (ref 3–12)
Neutro Abs: 3.3 10*3/uL (ref 1.7–7.7)
Neutrophils Relative %: 52 % (ref 43–77)
PLATELETS: 203 10*3/uL (ref 150–400)
RBC: 4.48 MIL/uL (ref 4.22–5.81)
RDW: 13 % (ref 11.5–15.5)
WBC: 6.5 10*3/uL (ref 4.0–10.5)

## 2015-02-05 LAB — D-DIMER, QUANTITATIVE (NOT AT ARMC): D DIMER QUANT: 1.61 ug{FEU}/mL — AB (ref 0.00–0.48)

## 2015-02-05 MED ORDER — IOHEXOL 350 MG/ML SOLN
100.0000 mL | Freq: Once | INTRAVENOUS | Status: AC | PRN
  Administered 2015-02-05: 100 mL via INTRAVENOUS

## 2015-02-05 NOTE — ED Notes (Signed)
Reports hx of dvt in the right leg. States that Primary asked him to report to the ED because his levels are elevated.

## 2015-02-05 NOTE — ED Notes (Signed)
Sob. Hx of pneumonia 3 weeks ago. He was started on antibiotics. After finishing antibiotics he continues to have dizziness and sob with exertion. He is speaking in complete sentences. He had a cxr today that showed improvement. He had a d dimer that was elevated.

## 2015-02-06 MED ORDER — ALBUTEROL SULFATE HFA 108 (90 BASE) MCG/ACT IN AERS
INHALATION_SPRAY | RESPIRATORY_TRACT | Status: AC
  Administered 2015-02-06: 2
  Filled 2015-02-06: qty 6.7

## 2015-02-06 NOTE — ED Provider Notes (Addendum)
CSN: [id number]     Arrival date & time 02/05/15  2201 History   First MD Initiated Contact with Patient 02/06/15 0013     Chief Complaint  Patient presents with  . Shortness of Breath     (Consider location/radiation/quality/duration/timing/severity/associated sxs/prior Treatment) HPI  This is a 49 year old male with a history of DVT. He was previously on Xarelto but was switched to aspirin about a year ago. He was diagnosed with pneumonia 3 weeks ago and treated with two antibiotics. Despite treatment he has had persistent shortness of breath and dyspnea on exertion that has been worsening. It is severe enough that activities which may have taken him an hour before now take them all day. He denies chest pain but has had some sense of heaviness. He has had some slight edema of the right lower leg which is in a compression stocking. He was seen by his primary care physician yesterday and lab work showed an elevated d-dimer. He was sent here for further evaluation. He reports an unremarkable chest x-ray. He denies acute pain or swelling in his lower extremities.  Past Medical History  Diagnosis Date  . Back pain   . Hypertension   . Diabetes mellitus without complication   . Sleep difficulties   . Kidney stone   . DVT (deep venous thrombosis)    Past Surgical History  Procedure Laterality Date  . Ankle surgery     No family history on file. History  Substance Use Topics  . Smoking status: Never Smoker   . Smokeless tobacco: Not on file  . Alcohol Use: No    Review of Systems  All other systems reviewed and are negative.   Allergies  Diclofenac sodium; Gabapentin; and Tramadol  Home Medications   Prior to Admission medications   Medication Sig Start Date End Date Taking? Authorizing Provider  acetaminophen (TYLENOL) 500 MG tablet Take 1 tablet (500 mg total) by mouth every 6 (six) hours as needed. 04/05/14   Merryl Hacker, MD  aspirin EC 81 MG tablet Take 81 mg by mouth  daily.    Historical Provider, MD  atorvastatin (LIPITOR) 20 MG tablet Take 20 mg by mouth daily.    Historical Provider, MD  lisinopril-hydrochlorothiazide (PRINZIDE,ZESTORETIC) 20-12.5 MG per tablet Take 1 tablet by mouth daily.    Historical Provider, MD  metFORMIN (GLUCOPHAGE) 1000 MG tablet Take 1,000 mg by mouth 2 (two) times daily with a meal.    Historical Provider, MD  oxymorphone (OPANA) 5 MG tablet Take 5 mg by mouth every 6 (six) hours as needed for pain.    Historical Provider, MD  pantoprazole (PROTONIX) 20 MG tablet Take 20 mg by mouth daily.     Historical Provider, MD  polyethylene glycol (MIRALAX / GLYCOLAX) packet Take 17 g by mouth daily as needed (for constipation).    Historical Provider, MD   BP 120/88 mmHg  Pulse 80  Temp(Src) 98.1 F (36.7 C) (Oral)  Resp 20  Ht 5\' 11"  (1.803 m)  Wt 234 lb (106.142 kg)  BMI 32.65 kg/m2  SpO2 100%   Physical Exam General: Well-developed, well-nourished male in no acute distress; appearance consistent with age of record HENT: normocephalic; atraumatic Eyes: pupils equal, round and reactive to light; extraocular muscles intact Neck: supple Heart: regular rate and rhythm Lungs: clear to auscultation bilaterally Abdomen: soft; nondistended; nontender; no masses or hepatosplenomegaly; bowel sounds present Extremities: No deformity; full range of motion; pulses normal; right lower leg in compression stocking Neurologic:  Awake, alert and oriented; motor function intact in all extremities and symmetric; no facial droop Skin: Warm and dry Psychiatric: Normal mood and affect    ED Course  Procedures (including critical care time)   MDM   Nursing notes and vitals signs, including pulse oximetry, reviewed.  Summary of this visit's results, reviewed by myself:  Labs:  Results for orders placed or performed during the hospital encounter of 02/05/15 (from the past 24 hour(s))  Basic metabolic panel     Status: Abnormal    Collection Time: 02/05/15 11:00 PM  Result Value Ref Range   Sodium 136 135 - 145 mmol/L   Potassium 3.8 3.5 - 5.1 mmol/L   Chloride 107 101 - 111 mmol/L   CO2 24 22 - 32 mmol/L   Glucose, Bld 126 (H) 65 - 99 mg/dL   BUN 13 6 - 20 mg/dL   Creatinine, Ser 0.87 0.61 - 1.24 mg/dL   Calcium 8.7 (L) 8.9 - 10.3 mg/dL   GFR calc non Af Amer >60 >60 mL/min   GFR calc Af Amer >60 >60 mL/min   Anion gap 5 5 - 15  CBC with Differential     Status: None   Collection Time: 02/05/15 11:00 PM  Result Value Ref Range   WBC 6.5 4.0 - 10.5 K/uL   RBC 4.48 4.22 - 5.81 MIL/uL   Hemoglobin 13.1 13.0 - 17.0 g/dL   HCT 40.3 39.0 - 52.0 %   MCV 90.0 78.0 - 100.0 fL   MCH 29.2 26.0 - 34.0 pg   MCHC 32.5 30.0 - 36.0 g/dL   RDW 13.0 11.5 - 15.5 %   Platelets 203 150 - 400 K/uL   Neutrophils Relative % 52 43 - 77 %   Neutro Abs 3.3 1.7 - 7.7 K/uL   Lymphocytes Relative 37 12 - 46 %   Lymphs Abs 2.4 0.7 - 4.0 K/uL   Monocytes Relative 8 3 - 12 %   Monocytes Absolute 0.5 0.1 - 1.0 K/uL   Eosinophils Relative 3 0 - 5 %   Eosinophils Absolute 0.2 0.0 - 0.7 K/uL   Basophils Relative 0 0 - 1 %   Basophils Absolute 0.0 0.0 - 0.1 K/uL  D-dimer, quantitative (not at [facility])     Status: Abnormal   Collection Time: 02/05/15 11:00 PM  Result Value Ref Range   D-Dimer, Quant 1.61 (H) 0.00 - 0.48 ug/mL-FEU    EKG Interpretation  Date/Time:  Wednesday February 06 2015 01:05:44 EDT Ventricular Rate:  71 PR Interval:  166 QRS Duration: 104 QT Interval:  398 QTC Calculation: 432 R Axis:   37 Text Interpretation:  Normal sinus rhythm Normal ECG Rate is slower Confirmed by Adeline Petitfrere  MD, Jenny Reichmann ([id number]) on 02/06/2015 2:23:42 AM        Imaging Studies: Ct Angio Chest Pe W/cm &/or Wo Cm  02/06/2015   CLINICAL DATA:  Possible PE. Elevated D-dimer. Previously treated pneumonia.  EXAM: CT ANGIOGRAPHY CHEST WITH CONTRAST  TECHNIQUE: Multidetector CT imaging of the chest was performed using the standard protocol during bolus  administration of intravenous contrast. Multiplanar CT image reconstructions and MIPs were obtained to evaluate the vascular anatomy.  CONTRAST:  137mL OMNIPAQUE IOHEXOL 350 MG/ML SOLN  COMPARISON:  Chest radiograph 02/05/2015. Chest radiograph 01/22/2015.  FINDINGS: Mediastinum: No filling defects are noted within the pulmonary arterial tree to suggest pulmonary emboli. Heart size is normal. No pericardial fluid, thickening or calcification. No acute abnormality of the thoracic aorta or other  great vessels of the mediastinum. Coronary artery calcification. No pathologically enlarged mediastinal or hilar lymph nodes. The esophagus is normal in appearance.  Lungs/Pleura: No consolidative airspace disease. No pleural effusions. No suspicious appearing pulmonary nodules or masses. Small air cysts are scattered throughout both lung fields. Minimal pleural thickening on the LEFT.  Upper Abdomen: Visualized portions of the upper abdomen are unremarkable.  Musculoskeletal: No aggressive appearing lytic or blastic lesions are noted in the visualized portions of the skeleton.  Review of the MIP images confirms the above findings.  IMPRESSION: No evidence for pulmonary emboli. No acute cardiopulmonary disease. No residual pulmonary opacity to suggest persistent pneumonia.   Electronically Signed   By: Rolla Flatten M.D.   On: 02/06/2015 00:34   1:51 AM Patient given albuterol inhaler and instructed in shoes. He was advised of negative CT indeed for follow-up with his PCP if symptoms should persist.    Shanon Rosser, MD 02/06/15 [place], MD 02/06/15 [place], MD 02/06/15 [id number]

## 2015-02-26 ENCOUNTER — Telehealth: Payer: Self-pay | Admitting: Cardiovascular Disease

## 2015-02-26 NOTE — Telephone Encounter (Signed)
Received records from [place] @ [facility] for appointment with Dr Gwenlyn Found on 03/08/15.  Records given to [facility] (medical records) for Dr Kennon Holter schedule on 03/08/15. lp

## 2015-03-05 ENCOUNTER — Encounter: Payer: Self-pay | Admitting: *Deleted

## 2015-03-08 ENCOUNTER — Ambulatory Visit (INDEPENDENT_AMBULATORY_CARE_PROVIDER_SITE_OTHER): Payer: BLUE CROSS/BLUE SHIELD

## 2015-03-08 ENCOUNTER — Ambulatory Visit (INDEPENDENT_AMBULATORY_CARE_PROVIDER_SITE_OTHER): Payer: BLUE CROSS/BLUE SHIELD | Admitting: Cardiovascular Disease

## 2015-03-08 ENCOUNTER — Encounter: Payer: Self-pay | Admitting: Cardiovascular Disease

## 2015-03-08 VITALS — BP 132/80 | Ht 71.0 in | Wt 236.4 lb

## 2015-03-08 DIAGNOSIS — R42 Dizziness and giddiness: Secondary | ICD-10-CM

## 2015-03-08 DIAGNOSIS — E785 Hyperlipidemia, unspecified: Secondary | ICD-10-CM | POA: Insufficient documentation

## 2015-03-08 DIAGNOSIS — R0602 Shortness of breath: Secondary | ICD-10-CM | POA: Diagnosis not present

## 2015-03-08 DIAGNOSIS — I10 Essential (primary) hypertension: Secondary | ICD-10-CM

## 2015-03-08 DIAGNOSIS — E119 Type 2 diabetes mellitus without complications: Secondary | ICD-10-CM | POA: Insufficient documentation

## 2015-03-08 NOTE — Assessment & Plan Note (Signed)
Gordon Allen had pneumonia treated with an expressive 2 months ago. He presented to [facility] with shortness of breath possibly one month ago and apparently had a CT scan that was unrevealing. He continues to have shortness of breath on exertion. He does have positive cardiac risk factors including treated diabetes, hypertension and hyperlipidemia. I'm going to get a 2-D echo and an exercise Myoview to rule out an ischemic etiology.

## 2015-03-08 NOTE — Progress Notes (Signed)
03/08/2015 Gordon Allen   Jan 02, 1966  [id number]  Primary Physician Gennette Pac, MD Primary Cardiologist: Lorretta Harp MD Renae Gloss   HPI:  Gordon Allen is a 49 year old moderately overweight single Caucasian male patient of Gordon Allen who was formally a patient of Gordon Allen. He has a history of treated hypertension, diabetes and hyperlipidemia. He did have a right popliteal DVT back in 2013 about this on oral after graduation for 18 months which was then stopped. He apparently had a thrombophilia workup and was not told that he had any predisposition for this clotting. He had negative stress test back in 2009. He had a pneumonia treated approximately 2 months ago and about X and presented to [facility] a month ago with shortness of breath and dizziness. A chest CT was unrevealing. He continues to have shortness of breath and dizziness.   Current Outpatient Prescriptions  Medication Sig Dispense Refill  . aspirin EC 81 MG tablet Take 81 mg by mouth daily.    [name] atorvastatin (LIPITOR) 20 MG tablet Take 20 mg by mouth daily.    [name] lisinopril (PRINIVIL,ZESTRIL) 20 MG tablet Take 20 mg by mouth 2 (two) times daily.    . metFORMIN (GLUCOPHAGE) 1000 MG tablet Take 1,000 mg by mouth 2 (two) times daily with a meal.    . oxymorphone (OPANA) 5 MG tablet Take 5 mg by mouth every 6 (six) hours as needed for pain.    . pantoprazole (PROTONIX) 20 MG tablet Take 20 mg by mouth daily.     . polyethylene glycol (MIRALAX / GLYCOLAX) packet Take 17 g by mouth daily as needed (for constipation).     No current facility-administered medications for this visit.    Allergies  Allergen Reactions  . Diclofenac Sodium     Increased gas  . Gabapentin     Severe diarrhea  . Tramadol     nausea  . Oxycodone-Acetaminophen Nausea Only    History   Social History  . Marital Status: Single    Spouse Name: N/A  . Number of Children: N/A  . Years of  Education: N/A   Occupational History  . Not on file.   Social History Main Topics  . Smoking status: Never Smoker   . Smokeless tobacco: Not on file  . Alcohol Use: No  . Drug Use: No  . Sexual Activity: Not on file   Other Topics Concern  . Not on file   Social History Narrative     Review of Systems: General: negative for chills, fever, night sweats or weight changes.  Cardiovascular: negative for chest pain, dyspnea on exertion, edema, orthopnea, palpitations, paroxysmal nocturnal dyspnea or shortness of breath Dermatological: negative for rash Respiratory: negative for cough or wheezing Urologic: negative for hematuria Abdominal: negative for nausea, vomiting, diarrhea, bright red blood per rectum, melena, or hematemesis Neurologic: negative for visual changes, syncope, or dizziness All other systems reviewed and are otherwise negative except as noted above.    Blood pressure 132/80, height 5\' 11"  (1.803 m), weight 236 lb 6.4 oz (107.23 kg).  General appearance: alert and no distress Neck: no adenopathy, no carotid bruit, no JVD, supple, symmetrical, trachea midline and thyroid not enlarged, symmetric, no tenderness/mass/nodules Lungs: clear to auscultation bilaterally Heart: regular rate and rhythm, S1, S2 normal, no murmur, click, rub or gallop Extremities: extremities normal, atraumatic, no cyanosis or edema  EKG normal sinus rhythm at 70 without ST or T-wave changes. I  personally reviewed this EKG  ASSESSMENT AND PLAN:   Shortness of breath Gordon Allen had pneumonia treated with an expressive 2 months ago. He presented to [facility] with shortness of breath possibly one month ago and apparently had a CT scan that was unrevealing. He continues to have shortness of breath on exertion. He does have positive cardiac risk factors including treated diabetes, hypertension and hyperlipidemia. I'm going to get a 2-D echo and an exercise Myoview to rule out an ischemic  etiology.  Hyperlipidemia History of hyperlipidemia on atorvastatin 20 mg a day followed by his PCP  Essential hypertension History of hypertension blood pressure measured today at 130/80. He is on lisinopril. Continue current meds at current dosing  Dizziness History of dizziness which is been going on for several months. He does havesinus drainage issues and is scheduled for sinus surgery later next week. While I do not think that his dizziness is cardiovascular in nature and he does not have a blood pressure I am going to obtain a 2 week event monitor to rule out there is not a rhythm related cause.      Lorretta Harp MD FACP,FACC,FAHA, [facility] 03/08/2015 10:13 AM

## 2015-03-08 NOTE — Patient Instructions (Signed)
Medication Instructions:  Dr Gwenlyn Found has made no changes in your current medications or treatment plan.  Testing/Procedures: Your physician has requested that you have an echocardiogram. Echocardiography is a painless test that uses sound waves to create images of your heart. It provides your doctor with information about the size and shape of your heart and how well your heart's chambers and valves are working. This procedure takes approximately one hour. There are no restrictions for this procedure.  Your physician has requested that you have en exercise stress myoview. For further information please visit [email or web address]. Please follow instruction sheet, as given.  Your physician has recommended that you wear an event monitor. Event monitors are medical devices that record the heart's electrical activity. Doctors most often [place] these monitors to diagnose arrhythmias. Arrhythmias are problems with the speed or rhythm of the heartbeat. The monitor is a small, portable device. You can wear one while you do your normal daily activities. This is usually used to diagnose what is causing palpitations/syncope (passing out).  Follow-Up: Dr Gwenlyn Found recommends that you schedule a follow-up appointment in 1 month.  Your Doctor has ordered you to wear a heart monitor. You will wear this for 14 days.   TIPS -  REMINDERS 1. The sensor is the lanyard that is worn around your neck every day - this is powered by a battery that needs to be changed every day 2. The monitor is the device that allows you to record symptoms - this will need to be charged daily 3. The sensor & monitor need to be within 100 feet of each other at all times 4. The sensor connects to the electrodes (stickers) - these should be changed every 24-48 hours (you do not have to remove them when you bathe, just make sure they are dry when you connect it back to the sensor 5. If you need more supplies (electrodes, batteries), please call the  1-800 # on the back of the pamphlet and [employer] will mail you more supplies 6. If your skin becomes sensitive, please try the sample pack of sensitive skin electrodes (the white packet in your silver box) and call [employer] to have them mail you more of these type of electrodes 7. When you are finish wearing the monitor, please place all supplies back in the silver box, place the silver box in the pre-packaged UPS bag and drop off at [employer] or call them so they can come pick it up   Cardiac Event Monitoring A cardiac event monitor is a small recording device used to help detect abnormal heart rhythms (arrhythmias). The monitor is used to record heart rhythm when noticeable symptoms such as the following occur:  Fast heartbeats (palpitations), such as heart racing or fluttering.  Dizziness.  Fainting or light-headedness.  Unexplained weakness. The monitor is wired to two electrodes placed on your chest. Electrodes are flat, sticky disks that attach to your skin. The monitor can be worn for up to 30 days. You will wear the monitor at all times, except when bathing.  HOW TO USE YOUR CARDIAC EVENT MONITOR A technician will prepare your chest for the electrode placement. The technician will show you how to place the electrodes, how to work the monitor, and how to replace the batteries. Take time to practice using the monitor before you leave the office. Make sure you understand how to send the information from the monitor to your health care provider. This requires a telephone with a landline, not a cell phone.  You need to:  Wear your monitor at all times, except when you are in water:  Do not get the monitor wet.  Take the monitor off when bathing. Do not swim or use a hot tub with it on.  Keep your skin clean. Do not put body lotion or moisturizer on your chest.  Change the electrodes daily or any time they stop sticking to your skin. You might need to use tape to keep them on.  It is  possible that your skin under the electrodes could become irritated. To keep this from happening, try to put the electrodes in slightly different places on your chest. However, they must remain in the area under your left breast and in the upper right section of your chest.  Make sure the monitor is safely clipped to your clothing or in a location close to your body that your health care provider recommends.  Press the button to record when you feel symptoms of heart trouble, such as dizziness, weakness, light-headedness, palpitations, thumping, shortness of breath, unexplained weakness, or a fluttering or racing heart. The monitor is always on and records what happened slightly before you pressed the button, so do not worry about being too late to get good information.  Keep a diary of your activities, such as walking, doing chores, and taking medicine. It is especially important to note what you were doing when you pushed the button to record your symptoms. This will help your health care provider determine what might be contributing to your symptoms. The information stored in your monitor will be reviewed by your health care provider alongside your diary entries.  Send the recorded information as recommended by your health care provider. It is important to understand that it will take some time for your health care provider to process the results.  Change the batteries as recommended by your health care provider. SEEK IMMEDIATE MEDICAL CARE IF:   You have chest pain.  You have extreme difficulty breathing or shortness of breath.  You develop a very fast heartbeat that persists.  You develop dizziness that does not go away.  You faint or constantly feel you are about to faint. Document Released: 05/26/2008 Document Revised: 01/01/2014 Document Reviewed: 02/13/2013 [facility] Patient Information 2015 [place], [place]. This information is not intended to replace advice given to you by your health  care provider. Make sure you discuss any questions you have with your health care provider.

## 2015-03-08 NOTE — Assessment & Plan Note (Signed)
History of hypertension blood pressure measured today at 130/80. He is on lisinopril. Continue current meds at current dosing

## 2015-03-08 NOTE — Assessment & Plan Note (Signed)
History of dizziness which is been going on for several months. He does havesinus drainage issues and is scheduled for sinus surgery later next week. While I do not think that his dizziness is cardiovascular in nature and he does not have a blood pressure I am going to obtain a 2 week event monitor to rule out there is not a rhythm related cause.

## 2015-03-08 NOTE — Assessment & Plan Note (Signed)
History of hyperlipidemia on atorvastatin 20 mg a day followed by his PCP 

## 2015-03-11 ENCOUNTER — Telehealth (HOSPITAL_COMMUNITY): Payer: Self-pay | Admitting: *Deleted

## 2015-03-11 NOTE — Telephone Encounter (Signed)
Patient given detailed instructions per Myocardial Perfusion Study Information Sheet for test on 03/12/15 at 1215. Patient Notified to arrive 15 minutes early, and that it is imperative to arrive on time for appointment to keep from having the test rescheduled. Patient verbalized understanding. Yaeli Hartung, Ranae Palms

## 2015-03-12 ENCOUNTER — Ambulatory Visit (HOSPITAL_COMMUNITY): Payer: BLUE CROSS/BLUE SHIELD | Attending: Cardiology

## 2015-03-12 DIAGNOSIS — R0602 Shortness of breath: Secondary | ICD-10-CM | POA: Insufficient documentation

## 2015-03-12 DIAGNOSIS — R42 Dizziness and giddiness: Secondary | ICD-10-CM

## 2015-03-12 LAB — MYOCARDIAL PERFUSION IMAGING
CHL RATE OF PERCEIVED EXERTION: 18
CSEPED: 9 min
CSEPHR: 87 %
Estimated workload: 10.1 METS
Exercise duration (sec): 0 s
LV dias vol: 145 mL
LVSYSVOL: 71 mL
MPHR: 173 {beats}/min
NUC STRESS TID: 1
Peak HR: 151 {beats}/min
RATE: 0.37
Rest HR: 67 {beats}/min
SDS: 4
SRS: 0
SSS: 4

## 2015-03-12 MED ORDER — TECHNETIUM TC 99M SESTAMIBI GENERIC - CARDIOLITE
32.6000 | Freq: Once | INTRAVENOUS | Status: AC | PRN
  Administered 2015-03-12: 33 via INTRAVENOUS

## 2015-03-12 MED ORDER — TECHNETIUM TC 99M SESTAMIBI GENERIC - CARDIOLITE
10.1000 | Freq: Once | INTRAVENOUS | Status: AC | PRN
  Administered 2015-03-12: 10.1 via INTRAVENOUS

## 2015-03-13 ENCOUNTER — Other Ambulatory Visit: Payer: Self-pay

## 2015-03-13 ENCOUNTER — Other Ambulatory Visit: Payer: Self-pay | Admitting: Cardiovascular Disease

## 2015-03-13 ENCOUNTER — Ambulatory Visit (HOSPITAL_COMMUNITY): Payer: BLUE CROSS/BLUE SHIELD | Attending: Cardiology

## 2015-03-13 DIAGNOSIS — R0602 Shortness of breath: Secondary | ICD-10-CM | POA: Diagnosis not present

## 2015-03-13 DIAGNOSIS — R42 Dizziness and giddiness: Secondary | ICD-10-CM

## 2015-03-13 DIAGNOSIS — I340 Nonrheumatic mitral (valve) insufficiency: Secondary | ICD-10-CM | POA: Diagnosis not present

## 2015-03-13 DIAGNOSIS — R0600 Dyspnea, unspecified: Secondary | ICD-10-CM | POA: Diagnosis present

## 2015-03-13 DIAGNOSIS — I071 Rheumatic tricuspid insufficiency: Secondary | ICD-10-CM | POA: Diagnosis not present

## 2015-03-13 DIAGNOSIS — I517 Cardiomegaly: Secondary | ICD-10-CM | POA: Diagnosis not present

## 2015-03-14 ENCOUNTER — Telehealth: Payer: Self-pay | Admitting: Cardiovascular Disease

## 2015-03-14 ENCOUNTER — Encounter: Payer: Self-pay | Admitting: *Deleted

## 2015-03-14 NOTE — Telephone Encounter (Signed)
I will fax encounter to [facility] at Dr Berle Mull office.

## 2015-03-14 NOTE — Telephone Encounter (Signed)
Encounter closed at this time.

## 2015-03-14 NOTE — Telephone Encounter (Signed)
Kay/Dr. Ernesto Rutherford, ENT - Fax #: [phone]: Called to obtain surgical clearance for patient to undergo sinus surgery tomorrow, Friday, July 15th, in the afternoon. Will route to Dr. Gwenlyn Found and Brita Romp, RN.

## 2015-03-14 NOTE — Telephone Encounter (Signed)
Nl 2D and myoview. Cleared for his ENT surgery tomorrow at low risk

## 2015-03-14 NOTE — Telephone Encounter (Signed)
Spoke with Sunday Spillers from [facility]. Patient is scheduled for surgery tomorrow and is wearing event monitor. Anesthesiologist would like event monitor reports. Faxed to her attention @ [phone]

## 2015-03-14 NOTE — Telephone Encounter (Signed)
Zigmund Daniel is calling the office to see if the patient's clearance form could be faxed over to the office at [phone]. His surgery is tomorrow. Please call  Thanks

## 2015-03-15 ENCOUNTER — Other Ambulatory Visit: Payer: Self-pay | Admitting: Otolaryngology

## 2015-03-18 ENCOUNTER — Telehealth: Payer: Self-pay | Admitting: *Deleted

## 2015-03-18 NOTE — Telephone Encounter (Signed)
Info about aortic aneurysm sent to patient.

## 2015-03-26 ENCOUNTER — Encounter: Payer: Self-pay | Admitting: Cardiovascular Disease

## 2015-04-05 ENCOUNTER — Ambulatory Visit (INDEPENDENT_AMBULATORY_CARE_PROVIDER_SITE_OTHER): Payer: BLUE CROSS/BLUE SHIELD | Admitting: Cardiovascular Disease

## 2015-04-05 ENCOUNTER — Encounter: Payer: Self-pay | Admitting: Cardiovascular Disease

## 2015-04-05 VITALS — BP 154/88 | HR 72 | Ht 71.0 in | Wt 241.8 lb

## 2015-04-05 DIAGNOSIS — I10 Essential (primary) hypertension: Secondary | ICD-10-CM

## 2015-04-05 DIAGNOSIS — I714 Abdominal aortic aneurysm, without rupture, unspecified: Secondary | ICD-10-CM

## 2015-04-05 DIAGNOSIS — R42 Dizziness and giddiness: Secondary | ICD-10-CM | POA: Diagnosis not present

## 2015-04-05 NOTE — Assessment & Plan Note (Signed)
History of hypertension blood pressure measured today at 154/88. He is on lisinopril. Continue current meds at current dosing

## 2015-04-05 NOTE — Assessment & Plan Note (Signed)
He complains of dizziness when bending over and getting up. His symptoms sound somewhat orthostatic. We will monitor which was somewhat problematic because of difficulty wearing the electrodes did show episodes of sinus tachycardia with rates of 140-160. We will continue to monitor this however.

## 2015-04-05 NOTE — Assessment & Plan Note (Signed)
A 2-D echo and Myoview stress test were all normal. He did have a generous in ascending thoracic aorta measuring 40 mm. This will need to be followed up in the future. His shortness of breath has somewhat improved.

## 2015-04-05 NOTE — Patient Instructions (Signed)
  We will see you back in follow up in 3 months with Dr Gwenlyn Found.   Dr Gwenlyn Found has ordered: 1. Abdominal aorta duplex. During this test, an ultrasound is used to evaluate the aorta. Allow 30 minutes for this exam. Do not eat after midnight the day before and avoid carbonated beverages

## 2015-04-05 NOTE — Progress Notes (Signed)
Gordon Allen returns today for follow-up of his outpatient noninvasive diagnostic tests. A 2-D echo was normal other than a generous thoracic aorta measuring 40 mm. A Myoview stress test was nonischemic. An event monitor was somewhat problematic because of difficulty keeping electrodes on due to excessive diaphoresis. Show episodes of sinus tachycardia. There is a history of abdominal aortic aneurysm in both his parents. I'm going to get abdominal ultrasound to follow this. Since one performed 5 years ago by Dr. Kellie Simmering did show mild abdominal aortic dilatation. I will see him back in 3 months.   Lorretta Harp, M.D., [place], [facility], [place] [address] [place], [place]  [id number]  [phone] 04/05/2015 10:52 AM

## 2015-04-26 ENCOUNTER — Ambulatory Visit (HOSPITAL_COMMUNITY)
Admission: RE | Admit: 2015-04-26 | Discharge: 2015-04-26 | Disposition: A | Discharge status: Home / Self Care | Payer: BLUE CROSS/BLUE SHIELD | Source: Ambulatory Visit | Attending: Cardiology | Admitting: Cardiology

## 2015-04-26 DIAGNOSIS — I714 Abdominal aortic aneurysm, without rupture, unspecified: Secondary | ICD-10-CM

## 2015-04-29 ENCOUNTER — Encounter: Payer: Self-pay | Admitting: *Deleted

## 2015-05-13 ENCOUNTER — Other Ambulatory Visit: Payer: Self-pay

## 2015-07-19 ENCOUNTER — Ambulatory Visit (INDEPENDENT_AMBULATORY_CARE_PROVIDER_SITE_OTHER): Payer: BLUE CROSS/BLUE SHIELD | Admitting: Cardiovascular Disease

## 2015-07-19 ENCOUNTER — Encounter: Payer: Self-pay | Admitting: Cardiovascular Disease

## 2015-07-19 VITALS — BP 122/80 | HR 60 | Ht 70.0 in | Wt 244.0 lb

## 2015-07-19 DIAGNOSIS — E785 Hyperlipidemia, unspecified: Secondary | ICD-10-CM | POA: Diagnosis not present

## 2015-07-19 DIAGNOSIS — I10 Essential (primary) hypertension: Secondary | ICD-10-CM

## 2015-07-19 NOTE — Progress Notes (Signed)
07/19/2015 Gordon Allen   [birth year]  [id number]  Primary Physician Gennette Pac, MD Primary Cardiologist: Lorretta Harp MD Gordon Allen   HPI:  Mr.Gordon Allen is a 49 year old moderately overweight single Caucasian male patient of Dr. Aurther Loft who was formally a patient of Dr. Merri Ray. I last saw him in the hot office 04/05/15. He has a history of treated hypertension, diabetes and hyperlipidemia. He did have a right popliteal DVT back in 2013 about this on oral after graduation for 18 months which was then stopped. He apparently had a thrombophilia workup and was not told that he had any predisposition for this clotting. He had negative stress test back in 2009. He had a pneumonia treated approximately 2 months ago and about X and presented to [facility] over the summer with shortness of breath and dizziness. A chest CT was unrevealing. He continues to have occasional dizziness when going from a bending to a standing position. Abdominal ultrasound did not show evidence of abdominal aortic aneurysm.   Current Outpatient Prescriptions  Medication Sig Dispense Refill  . aspirin EC 81 MG tablet Take 81 mg by mouth daily.    [name] atorvastatin (LIPITOR) 20 MG tablet Take 20 mg by mouth daily.    [name] glimepiride (AMARYL) 2 MG tablet Take 1 tablet by mouth daily.  12  . lisinopril (PRINIVIL,ZESTRIL) 20 MG tablet Take 20 mg by mouth 2 (two) times daily.    . metFORMIN (GLUCOPHAGE) 1000 MG tablet Take 1,000 mg by mouth 2 (two) times daily with a meal.    . oxymorphone (OPANA) 5 MG tablet Take 5 mg by mouth every 6 (six) hours as needed for pain.    . pantoprazole (PROTONIX) 20 MG tablet Take 20 mg by mouth daily.     . polyethylene glycol (MIRALAX / GLYCOLAX) packet Take 17 g by mouth daily as needed (for constipation).     No current facility-administered medications for this visit.    Allergies  Allergen Reactions  . Diclofenac Sodium     Increased  gas  . Gabapentin     Severe diarrhea  . Tramadol     nausea  . Oxycodone-Acetaminophen Nausea Only    Social History   Social History  . Marital Status: Single    Spouse Name: N/A  . Number of Children: N/A  . Years of Education: N/A   Occupational History  . Not on file.   Social History Main Topics  . Smoking status: Never Smoker   . Smokeless tobacco: Not on file  . Alcohol Use: No  . Drug Use: No  . Sexual Activity: Not on file   Other Topics Concern  . Not on file   Social History Narrative     Review of Systems: General: negative for chills, fever, night sweats or weight changes.  Cardiovascular: negative for chest pain, dyspnea on exertion, edema, orthopnea, palpitations, paroxysmal nocturnal dyspnea or shortness of breath Dermatological: negative for rash Respiratory: negative for cough or wheezing Urologic: negative for hematuria Abdominal: negative for nausea, vomiting, diarrhea, bright red blood per rectum, melena, or hematemesis Neurologic: negative for visual changes, syncope, or dizziness All other systems reviewed and are otherwise negative except as noted above.    Blood pressure 122/80, pulse 60, height 5\' 10"  (1.778 m), weight 244 lb (110.678 kg).  General appearance: alert and no distress Neck: no adenopathy, no carotid bruit, no JVD, supple, symmetrical, trachea midline and thyroid not enlarged, symmetric, no  tenderness/mass/nodules Lungs: clear to auscultation bilaterally Heart: regular rate and rhythm, S1, S2 normal, no murmur, click, rub or gallop Extremities: extremities normal, atraumatic, no cyanosis or edema  EKG not performed today  ASSESSMENT AND PLAN:   Hyperlipidemia History of hyperlipidemia on atorvastatin followed by his PCP  Essential hypertension History of hypertension with blood pressure measured today at 120/80. He is on lisinopril. Continue current meds at current dosing      Lorretta Harp MD [facility], [place],  [facility] 07/19/2015 8:25 AM

## 2015-07-19 NOTE — Assessment & Plan Note (Addendum)
History of hyperlipidemia on atorvastatin followed by his PCP 

## 2015-07-19 NOTE — Assessment & Plan Note (Signed)
History of hypertension with blood pressure measured today at 120/80. He is on lisinopril. Continue current meds at current dosing

## 2015-07-19 NOTE — Patient Instructions (Signed)

## 2015-07-22 ENCOUNTER — Encounter: Payer: Self-pay | Admitting: Cardiovascular Disease

## 2015-12-18 ENCOUNTER — Ambulatory Visit (INDEPENDENT_AMBULATORY_CARE_PROVIDER_SITE_OTHER): Payer: BLUE CROSS/BLUE SHIELD | Admitting: Allergy and Immunology

## 2015-12-18 ENCOUNTER — Encounter: Payer: Self-pay | Admitting: Allergy and Immunology

## 2015-12-18 DIAGNOSIS — J3089 Other allergic rhinitis: Secondary | ICD-10-CM | POA: Diagnosis not present

## 2015-12-18 MED ORDER — BECLOMETHASONE DIPROPIONATE 80 MCG/ACT NA AERS: INHALATION_SPRAY | NASAL | Status: DC

## 2015-12-18 MED ORDER — LEVOCETIRIZINE DIHYDROCHLORIDE 5 MG PO TABS: 5.0000 mg | ORAL_TABLET | Freq: Every evening | ORAL | Status: DC

## 2015-12-18 NOTE — Assessment & Plan Note (Signed)
   Aeroallergen avoidance measures have been discussed and provided in written form.  A sample and prescription have been provided for Qnasl 80 g, one actuation per nostril twice daily as needed.  Proper technique has been discussed and demonstrated.  A prescription has been provided for levocetirizine, 5 mg daily as needed.  If allergen avoidance measures and medications fail to adequately relieve symptoms, aeroallergen immunotherapy will be considered.

## 2015-12-18 NOTE — Patient Instructions (Addendum)
Perennial and seasonal allergic rhinitis with possible nonallergic component  Aeroallergen avoidance measures have been discussed and provided in written form.  A sample and prescription have been provided for Qnasl 80 g, one actuation per nostril twice daily as needed.  Proper technique has been discussed and demonstrated.  A prescription has been provided for levocetirizine, 5 mg daily as needed.  If allergen avoidance measures and medications fail to adequately relieve symptoms, aeroallergen immunotherapy will be considered.    Return in about 4 months (around 04/18/2016), or if symptoms worsen or fail to improve.  Reducing Pollen Exposure  The American Academy of Allergy, Asthma and Immunology suggests the following steps to reduce your exposure to pollen during allergy seasons.    1. Do not hang sheets or clothing out to dry; pollen may collect on these items. 2. Do not mow lawns or spend time around freshly cut grass; mowing stirs up pollen. 3. Keep windows closed at night.  Keep car windows closed while driving. 4. Minimize morning activities outdoors, a time when pollen counts are usually at their highest. 5. Stay indoors as much as possible when pollen counts or humidity is high and on windy days when pollen tends to remain in the air longer. 6. Use air conditioning when possible.  Many air conditioners have filters that trap the pollen spores. 7. Use a HEPA room air filter to remove pollen form the indoor air you breathe.   Control of House Dust Mite Allergen  House dust mites play a major role in allergic asthma and rhinitis.  They occur in environments with high humidity wherever human skin, the food for dust mites is found. High levels have been detected in dust obtained from mattresses, pillows, carpets, upholstered furniture, bed covers, clothes and soft toys.  The principal allergen of the house dust mite is found in its feces.  A gram of dust may contain 1,000 mites and  250,000 fecal particles.  Mite antigen is easily measured in the air during house cleaning activities.    1. Encase mattresses, including the box spring, and pillow, in an air tight cover.  Seal the zipper end of the encased mattresses with wide adhesive tape. 2. Wash the bedding in water of 130 degrees Farenheit weekly.  Avoid cotton comforters/quilts and flannel bedding: the most ideal bed covering is the dacron comforter. 3. Remove all upholstered furniture from the bedroom. 4. Remove carpets, carpet padding, rugs, and non-washable window drapes from the bedroom.  Wash drapes weekly or use plastic window coverings. 5. Remove all non-washable stuffed toys from the bedroom.  Wash stuffed toys weekly. 6. Have the room cleaned frequently with a vacuum cleaner and a damp dust-mop.  The patient should not be in a room which is being cleaned and should wait 1 hour after cleaning before going into the room. 7. Close and seal all heating outlets in the bedroom.  Otherwise, the room will become filled with dust-laden air.  An electric heater can be used to heat the room. Reduce indoor humidity to less than 50%.  Do not use a humidifier.  Control of Dog or Cat Allergen  Avoidance is the best way to manage a dog or cat allergy. If you have a dog or cat and are allergic to dog or cats, consider removing the dog or cat from the home. If you have a dog or cat but don't want to find it a new home, or if your family wants a pet even though someone in  the household is allergic, here are some strategies that may help keep symptoms at bay:  1. Keep the pet out of your bedroom and restrict it to only a few rooms. Be advised that keeping the dog or cat in only one room will not limit the allergens to that room. 2. Don't pet, hug or kiss the dog or cat; if you do, wash your hands with soap and water. 3. High-efficiency particulate air (HEPA) cleaners run continuously in a bedroom or living room can reduce allergen  levels over time. 4. Regular use of a high-efficiency vacuum cleaner or a central vacuum can reduce allergen levels. 5. Giving your dog or cat a bath at least once a week can reduce airborne allergen.  Control of Mold Allergen  Mold and fungi can grow on a variety of surfaces provided certain temperature and moisture conditions exist.  Outdoor molds grow on plants, decaying vegetation and soil.  The major outdoor mold, Alternaria and Cladosporium, are found in very high numbers during hot and dry conditions.  Generally, a late Summer - Fall peak is seen for common outdoor fungal spores.  Rain will temporarily lower outdoor mold spore count, but counts rise rapidly when the rainy period ends.  The most important indoor molds are Aspergillus and Penicillium.  Dark, humid and poorly ventilated basements are ideal sites for mold growth.  The next most common sites of mold growth are the bathroom and the kitchen.  Outdoor [employer] 1. Use air conditioning and keep windows closed 2. Avoid exposure to decaying vegetation. 3. Avoid leaf raking. 4. Avoid grain handling. 5. Consider wearing a face mask if working in moldy areas.  Indoor Mold Control 1. Maintain humidity below 50%. 2. Clean washable surfaces with 5% bleach solution. 3. Remove sources e.g. Contaminated carpets.  Control of Cockroach Allergen  Cockroach allergen has been identified as an important cause of acute attacks of asthma, especially in urban settings.  There are fifty-five species of cockroach that exist in the [place], however only three, the [place], [place] species produce allergen that can affect patients with Asthma.  Allergens can be obtained from fecal particles, egg casings and secretions from cockroaches.    1. Remove food sources. 2. Reduce access to water. 3. Seal access and entry points. 4. Spray runways with 0.5-1% Diazinon or Chlorpyrifos 5. Blow boric acid power under stoves and  refrigerator. 6. Place bait stations (hydramethylnon) at feeding sites.

## 2015-12-18 NOTE — Progress Notes (Signed)
New Patient Note  RE: Gordon Allen MRN: [id number] DOB: 09-Jan-1966 Date of Office Visit: 12/18/2015  Referring provider: Thornell Sartorius, MD Primary care provider: Gennette Pac, MD  Chief Complaint: Sinus Problem; Rhinitis ; and Allergy Testing   History of present illness: HPI Comments: Gordon Allen is a 50 y.o. male presenting today for consultation of rhinitis.  Over the past 5 years she has experienced rhinorrhea, postnasal drainage, irritated throat, and coughing.  He reports that saw Dr. Ernesto Rutherford last year and had an MRI revealing sinusitis.  He had sinus surgery in July 2016 and reports that since that time he has experienced thin clear rhinorrhea and postnasal drainage, and occasional severe frontal sinus pressure.  Specific triggers include working in the yard and rapid weather/temperature changes. Fexofenadine has helped to some degree.  He is unwilling to use medicated nasal sprays due to the sensation of the medication running down the back of his throat.  He reports that he was aeroallergen skin tested approximately 10 years ago with negative results.   Assessment and plan: Perennial and seasonal allergic rhinitis with possible nonallergic component  Aeroallergen avoidance measures have been discussed and provided in written form.  A sample and prescription have been provided for Qnasl 80 g, one actuation per nostril twice daily as needed.  Proper technique has been discussed and demonstrated.  A prescription has been provided for levocetirizine, 5 mg daily as needed.  If allergen avoidance measures and medications fail to adequately relieve symptoms, aeroallergen immunotherapy will be considered.    Meds ordered this encounter  Medications  . levocetirizine (XYZAL) 5 MG tablet    Sig: Take 1 tablet (5 mg total) by mouth every evening.    Dispense:  30 tablet    Refill:  5  . Beclomethasone Dipropionate (QNASL) 80 MCG/ACT AERS    Sig: One  actuation per nostril twice daily as needed    Dispense:  8.7 g    Refill:  5    Diagnositics: Epicutaneous testing: Cockroach antigen. Intradermal testing: Ragweed pollen, weed pollen, molds, dog epithelia, and dust mite antigen.    Physical examination: There were no vitals taken for this visit.  General: Alert, interactive, in no acute distress. HEENT: TMs pearly gray, turbinates moderately edematous with clear discharge, post-pharynx erythematous. Neck: Supple without lymphadenopathy. Lungs: Clear to auscultation without wheezing, rhonchi or rales. CV: Normal S1, S2 without murmurs. Abdomen: Nondistended, nontender. Skin: Warm and dry, without lesions or rashes. Extremities:  No clubbing, cyanosis or edema. Neuro:   Grossly intact.  Review of systems:  Review of Systems  Constitutional: Negative for fever, chills and weight loss.  HENT: Positive for congestion. Negative for nosebleeds.   Eyes: Negative for blurred vision.  Respiratory: Positive for cough. Negative for hemoptysis, shortness of breath and wheezing.   Cardiovascular: Negative for chest pain.  Gastrointestinal: Negative for diarrhea and constipation.  Genitourinary: Negative for dysuria.  Musculoskeletal: Negative for myalgias and joint pain.  Skin: Negative for itching and rash.  Neurological: Positive for headaches. Negative for dizziness.  Endo/Heme/Allergies: Positive for environmental allergies. Does not bruise/bleed easily.    Past medical history:  Past Medical History  Diagnosis Date  . Back pain   . Hypertension   . Diabetes mellitus without complication ([place])   . Sleep difficulties   . Kidney stone   . DVT (deep venous thrombosis) ([place])   . Abdominal aortic aneurysm ([place])   . Thoracic aortic aneurysm ([place])   . Dizziness   .  Shortness of breath     Past surgical history:  Past Surgical History  Procedure Laterality Date  . Ankle surgery    . Doppler echocardiography  07/09/2008,  01/20/2005    2009- Normal left ventricular size and function. Normal pattern of diastolic function. No hemodynamic significant valve disease. no evidence of pulmonary hypertension. No significant change compared to prior study from 01/20/2005.  2006- There is borderline concentric left ventricular hypertrophy. Left ventricular systolic function is normal.  . Nm myocar perf ejection fraction  06/13/2008    The post-stress ejection fraction is 58%. No significant ischemia demonstrated. This is a low risk scan. Normal myocardial perfusion imaging.  . Lower extremity venous doppler  11/26/2008,07/09/2008    Left greater saphenous vein reflux is identified with the caliber ranging from 0.70 cm to 0.46 cm knee to groin. the right and left greater saphenous veins are not aneurysmal. the right and left greater saphenous veins are not tortuous. The deep venous system is competent. The right and left lesser saphenous veins are competent.   2009-- No evidence of arterial insufficiency. Normal values.  . Abdominal aorta duplex  01/20/05    No evidence of aneurysmal dilatation. Evidence of aneurysmal dilatation.  . Holter moniter  12/29/2004    The predominant rhythm is Sinus Rhythm with periods of Sinus Tachycardia and Sinus Arrhythmis. Heart Rates: 139 bpm, Minimum: 46 bpm and Mean: 75 bpm.Aberrant Total: 4. Premature ventricular contractions. Atrial total: 25. No Ectopy    Family history: Family History  Problem Relation Age of Onset  . COPD Mother   . Thyroid disease Father   . Thyroid disease Sister   . Thyroid disease Brother   . Heart attack Maternal Grandmother   . Heart attack Maternal Grandfather     Social history: Social History   Social History  . Marital Status: Single    Spouse Name: N/A  . Number of Children: N/A  . Years of Education: N/A   Occupational History  . Not on file.   Social History Main Topics  . Smoking status: Never Smoker   . Smokeless tobacco: Not on file  .  Alcohol Use: No  . Drug Use: No  . Sexual Activity: Not on file   Other Topics Concern  . Not on file   Social History Narrative   Environmental History: The patient lives in a 49 year old house with hardwood floors throughout, gas heat, and central air.  He is a nonsmoker without pets in the home.  He is exposed to fumes, chemicals, and dust at work.    Medication List       This list is accurate as of: 12/18/15 12:56 PM.  Always use your most recent med list.               aspirin EC 81 MG tablet  Take 81 mg by mouth daily.     atorvastatin 20 MG tablet  Commonly known as:  LIPITOR  Take 20 mg by mouth daily.     Beclomethasone Dipropionate 80 MCG/ACT Aers  Commonly known as:  QNASL  One actuation per nostril twice daily as needed     glimepiride 2 MG tablet  Commonly known as:  AMARYL  Take 1 tablet by mouth daily.     levocetirizine 5 MG tablet  Commonly known as:  XYZAL  Take 1 tablet (5 mg total) by mouth every evening.     lisinopril 20 MG tablet  Commonly known as:  PRINIVIL,ZESTRIL  Take  20 mg by mouth 2 (two) times daily.     metFORMIN 1000 MG tablet  Commonly known as:  GLUCOPHAGE  Take 1,000 mg by mouth 2 (two) times daily with a meal.     oxymorphone 5 MG tablet  Commonly known as:  OPANA  Take 5 mg by mouth every 6 (six) hours as needed for pain.     pantoprazole 20 MG tablet  Commonly known as:  PROTONIX  Take 20 mg by mouth daily.     polyethylene glycol packet  Commonly known as:  MIRALAX / GLYCOLAX  Take 17 g by mouth daily as needed (for constipation).        Known medication allergies: Allergies  Allergen Reactions  . Diclofenac Sodium     Increased gas  . Gabapentin     Severe diarrhea  . Tramadol     nausea  . Oxycodone-Acetaminophen Nausea Only    I appreciate the opportunity to take part in this Liborio's care. Please do not hesitate to contact me with questions.  Sincerely,   R. Edgar Frisk, MD

## 2016-01-02 ENCOUNTER — Telehealth: Payer: Self-pay | Admitting: *Deleted

## 2016-01-02 MED ORDER — TRIAMCINOLONE ACETONIDE 55 MCG/ACT NA AERO
1.0000 | INHALATION_SPRAY | Freq: Two times a day (BID) | NASAL | Status: DC
Start: 2016-01-02 — End: 2017-01-06

## 2016-01-02 NOTE — Telephone Encounter (Signed)
Insurance will not cover Qnasl. Per Dr. Verlin Fester have patient use Nasacort AQ 1 spray each nostril twice daily. Patient informed by Lenna Sciara.

## 2016-01-10 DIAGNOSIS — L570 Actinic keratosis: Secondary | ICD-10-CM | POA: Diagnosis not present

## 2016-01-10 DIAGNOSIS — L219 Seborrheic dermatitis, unspecified: Secondary | ICD-10-CM | POA: Diagnosis not present

## 2016-01-15 DIAGNOSIS — Z0001 Encounter for general adult medical examination with abnormal findings: Secondary | ICD-10-CM | POA: Diagnosis not present

## 2016-01-15 DIAGNOSIS — E119 Type 2 diabetes mellitus without complications: Secondary | ICD-10-CM | POA: Diagnosis not present

## 2016-01-15 DIAGNOSIS — E785 Hyperlipidemia, unspecified: Secondary | ICD-10-CM | POA: Diagnosis not present

## 2016-01-15 DIAGNOSIS — I10 Essential (primary) hypertension: Secondary | ICD-10-CM | POA: Diagnosis not present

## 2016-01-15 DIAGNOSIS — N529 Male erectile dysfunction, unspecified: Secondary | ICD-10-CM | POA: Diagnosis not present

## 2016-01-15 DIAGNOSIS — Z23 Encounter for immunization: Secondary | ICD-10-CM | POA: Diagnosis not present

## 2016-01-16 DIAGNOSIS — M47816 Spondylosis without myelopathy or radiculopathy, lumbar region: Secondary | ICD-10-CM | POA: Diagnosis not present

## 2016-01-16 DIAGNOSIS — M4727 Other spondylosis with radiculopathy, lumbosacral region: Secondary | ICD-10-CM | POA: Diagnosis not present

## 2016-01-16 DIAGNOSIS — M160 Bilateral primary osteoarthritis of hip: Secondary | ICD-10-CM | POA: Diagnosis not present

## 2016-02-17 DIAGNOSIS — M160 Bilateral primary osteoarthritis of hip: Secondary | ICD-10-CM | POA: Diagnosis not present

## 2016-03-04 DIAGNOSIS — Z113 Encounter for screening for infections with a predominantly sexual mode of transmission: Secondary | ICD-10-CM | POA: Diagnosis not present

## 2016-03-04 DIAGNOSIS — R300 Dysuria: Secondary | ICD-10-CM | POA: Diagnosis not present

## 2016-03-17 DIAGNOSIS — R351 Nocturia: Secondary | ICD-10-CM | POA: Diagnosis not present

## 2016-03-17 DIAGNOSIS — R3914 Feeling of incomplete bladder emptying: Secondary | ICD-10-CM | POA: Diagnosis not present

## 2016-03-17 DIAGNOSIS — R300 Dysuria: Secondary | ICD-10-CM | POA: Diagnosis not present

## 2016-03-20 DIAGNOSIS — M160 Bilateral primary osteoarthritis of hip: Secondary | ICD-10-CM | POA: Diagnosis not present

## 2016-03-20 DIAGNOSIS — M47816 Spondylosis without myelopathy or radiculopathy, lumbar region: Secondary | ICD-10-CM | POA: Diagnosis not present

## 2016-04-02 DIAGNOSIS — R300 Dysuria: Secondary | ICD-10-CM | POA: Diagnosis not present

## 2016-04-02 DIAGNOSIS — R102 Pelvic and perineal pain: Secondary | ICD-10-CM | POA: Diagnosis not present

## 2016-04-02 DIAGNOSIS — L219 Seborrheic dermatitis, unspecified: Secondary | ICD-10-CM | POA: Diagnosis not present

## 2016-04-02 DIAGNOSIS — L570 Actinic keratosis: Secondary | ICD-10-CM | POA: Diagnosis not present

## 2016-04-06 DIAGNOSIS — M62838 Other muscle spasm: Secondary | ICD-10-CM | POA: Diagnosis not present

## 2016-04-06 DIAGNOSIS — R351 Nocturia: Secondary | ICD-10-CM | POA: Diagnosis not present

## 2016-04-06 DIAGNOSIS — R102 Pelvic and perineal pain: Secondary | ICD-10-CM | POA: Diagnosis not present

## 2016-04-06 DIAGNOSIS — R278 Other lack of coordination: Secondary | ICD-10-CM | POA: Diagnosis not present

## 2016-04-13 ENCOUNTER — Encounter: Payer: Self-pay | Admitting: Allergy and Immunology

## 2016-04-13 ENCOUNTER — Ambulatory Visit (INDEPENDENT_AMBULATORY_CARE_PROVIDER_SITE_OTHER): Payer: BLUE CROSS/BLUE SHIELD | Admitting: Allergy and Immunology

## 2016-04-13 DIAGNOSIS — R102 Pelvic and perineal pain: Secondary | ICD-10-CM | POA: Diagnosis not present

## 2016-04-13 DIAGNOSIS — J3089 Other allergic rhinitis: Secondary | ICD-10-CM

## 2016-04-13 DIAGNOSIS — R278 Other lack of coordination: Secondary | ICD-10-CM | POA: Diagnosis not present

## 2016-04-13 DIAGNOSIS — R109 Unspecified abdominal pain: Secondary | ICD-10-CM | POA: Diagnosis not present

## 2016-04-13 NOTE — Patient Instructions (Signed)
Perennial and seasonal allergic rhinitis with possible nonallergic component Unfortunately, Gordon Allen's therapeutic options are limited by his intolerance of medicated nasal sprays and nasal saline.  Continue appropriate allergen avoidance measures.    Samples have been provided for Qnasl 80 g, one actuation per nostril daily as needed.  Guaifenesin 1200 mg twice daily as needed with adequate hydration as discussed.   If allergen avoidance measures and medications fail to adequately relieve symptoms, aeroallergen immunotherapy will be considered.   Return in about 1 year (around 04/13/2017), or if symptoms worsen or fail to improve.

## 2016-04-13 NOTE — Assessment & Plan Note (Signed)
Unfortunately, Gordon Allen's therapeutic options are limited by his intolerance of medicated nasal sprays and nasal saline.  Continue appropriate allergen avoidance measures.    Samples have been provided for Qnasl 80 g, one actuation per nostril daily as needed.  Guaifenesin 1200 mg twice daily as needed with adequate hydration as discussed.   If allergen avoidance measures and medications fail to adequately relieve symptoms, aeroallergen immunotherapy will be considered.

## 2016-04-13 NOTE — Progress Notes (Signed)
Follow-up Note  RE: Gordon Allen MRN: [id number] DOB: [birth year] Date of Office Visit: 04/13/2016  Primary care provider: Gennette Pac, MD Referring provider: Hulan Fess, MD  History of present illness: Gordon Allen is a 50 y.o. male with perennial and seasonal allergic rhinitis presenting today for follow up.  He was last seen in this clinic on 12/18/2015.  He has not tried Qnasl because his insurance will cover.  He does not tolerate other nasal sprays or nasal saline spray/irrigation.  He has not had significant nasal or sinus symptoms in the interval since his previous visit, however he recognizes that his most significant symptoms occur in late fall/early winter.   Assessment and plan: Perennial and seasonal allergic rhinitis with possible nonallergic component Unfortunately, [name]'s therapeutic options are limited by his intolerance of medicated nasal sprays and nasal saline.  Continue appropriate allergen avoidance measures.    Samples have been provided for Qnasl 80 g, one actuation per nostril daily as needed.  Guaifenesin 1200 mg twice daily as needed with adequate hydration as discussed.   If allergen avoidance measures and medications fail to adequately relieve symptoms, aeroallergen immunotherapy will be considered.     Physical examination: Blood pressure 130/82, pulse 88, temperature 98 F (36.7 C), temperature source Oral, resp. rate 16.  General: Alert, interactive, in no acute distress. HEENT: TMs pearly gray, turbinates mildly edematous without discharge, post-pharynx mildly erythematous. Neck: Supple without lymphadenopathy. Lungs: Clear to auscultation without wheezing, rhonchi or rales. CV: Normal S1, S2 without murmurs. Skin: Warm and dry, without lesions or rashes.  The following portions of the patient's history were reviewed and updated as appropriate: allergies, current medications, past family history, past medical history,  past social history, past surgical history and problem list.    Medication List       Accurate as of 04/13/16  3:20 PM. Always use your most recent med list.          aspirin EC 81 MG tablet Take 81 mg by mouth daily.   atorvastatin 20 MG tablet Commonly known as:  LIPITOR Take 20 mg by mouth daily.   Beclomethasone Dipropionate 80 MCG/ACT Aers Commonly known as:  QNASL One actuation per nostril twice daily as needed   glimepiride 2 MG tablet Commonly known as:  AMARYL Take 1 tablet by mouth daily.   levocetirizine 5 MG tablet Commonly known as:  XYZAL Take 1 tablet (5 mg total) by mouth every evening.   lisinopril 20 MG tablet Commonly known as:  PRINIVIL,ZESTRIL Take 20 mg by mouth 2 (two) times daily.   metFORMIN 1000 MG tablet Commonly known as:  GLUCOPHAGE Take 1,000 mg by mouth 2 (two) times daily with a meal.   oxymorphone 5 MG tablet Commonly known as:  OPANA Take 5 mg by mouth every 6 (six) hours as needed for pain.   pantoprazole 20 MG tablet Commonly known as:  PROTONIX Take 20 mg by mouth daily.   polyethylene glycol packet Commonly known as:  MIRALAX / GLYCOLAX Take 17 g by mouth daily as needed (for constipation).   triamcinolone 55 MCG/ACT Aero nasal inhaler Commonly known as:  NASACORT AQ Place 1 spray into the nose 2 (two) times daily.       Allergies  Allergen Reactions  . Diclofenac Sodium     Increased gas  . Gabapentin     Severe diarrhea  . Tramadol     nausea  . Oxycodone-Acetaminophen Nausea Only    I appreciate  the opportunity to take part in Carvell's care. Please do not hesitate to contact me with questions.  Sincerely,   R. Edgar Frisk, MD

## 2016-04-24 DIAGNOSIS — M6281 Muscle weakness (generalized): Secondary | ICD-10-CM | POA: Diagnosis not present

## 2016-04-24 DIAGNOSIS — M62838 Other muscle spasm: Secondary | ICD-10-CM | POA: Diagnosis not present

## 2016-04-24 DIAGNOSIS — R109 Unspecified abdominal pain: Secondary | ICD-10-CM | POA: Diagnosis not present

## 2016-04-24 DIAGNOSIS — R278 Other lack of coordination: Secondary | ICD-10-CM | POA: Diagnosis not present

## 2016-04-27 DIAGNOSIS — M6281 Muscle weakness (generalized): Secondary | ICD-10-CM | POA: Diagnosis not present

## 2016-04-27 DIAGNOSIS — R3914 Feeling of incomplete bladder emptying: Secondary | ICD-10-CM | POA: Diagnosis not present

## 2016-04-27 DIAGNOSIS — M62838 Other muscle spasm: Secondary | ICD-10-CM | POA: Diagnosis not present

## 2016-04-29 DIAGNOSIS — M6281 Muscle weakness (generalized): Secondary | ICD-10-CM | POA: Diagnosis not present

## 2016-04-29 DIAGNOSIS — M62838 Other muscle spasm: Secondary | ICD-10-CM | POA: Diagnosis not present

## 2016-04-29 DIAGNOSIS — R278 Other lack of coordination: Secondary | ICD-10-CM | POA: Diagnosis not present

## 2016-04-29 DIAGNOSIS — R3914 Feeling of incomplete bladder emptying: Secondary | ICD-10-CM | POA: Diagnosis not present

## 2016-05-08 DIAGNOSIS — M6281 Muscle weakness (generalized): Secondary | ICD-10-CM | POA: Diagnosis not present

## 2016-05-08 DIAGNOSIS — M62838 Other muscle spasm: Secondary | ICD-10-CM | POA: Diagnosis not present

## 2016-05-08 DIAGNOSIS — R278 Other lack of coordination: Secondary | ICD-10-CM | POA: Diagnosis not present

## 2016-05-08 DIAGNOSIS — R109 Unspecified abdominal pain: Secondary | ICD-10-CM | POA: Diagnosis not present

## 2016-05-29 DIAGNOSIS — R3914 Feeling of incomplete bladder emptying: Secondary | ICD-10-CM | POA: Diagnosis not present

## 2016-05-29 DIAGNOSIS — M62838 Other muscle spasm: Secondary | ICD-10-CM | POA: Diagnosis not present

## 2016-05-29 DIAGNOSIS — R278 Other lack of coordination: Secondary | ICD-10-CM | POA: Diagnosis not present

## 2016-05-29 DIAGNOSIS — M6281 Muscle weakness (generalized): Secondary | ICD-10-CM | POA: Diagnosis not present

## 2016-06-03 DIAGNOSIS — M62838 Other muscle spasm: Secondary | ICD-10-CM | POA: Diagnosis not present

## 2016-06-03 DIAGNOSIS — R102 Pelvic and perineal pain: Secondary | ICD-10-CM | POA: Diagnosis not present

## 2016-06-03 DIAGNOSIS — R351 Nocturia: Secondary | ICD-10-CM | POA: Diagnosis not present

## 2016-06-03 DIAGNOSIS — R278 Other lack of coordination: Secondary | ICD-10-CM | POA: Diagnosis not present

## 2016-06-11 DIAGNOSIS — R109 Unspecified abdominal pain: Secondary | ICD-10-CM | POA: Diagnosis not present

## 2016-06-11 DIAGNOSIS — R278 Other lack of coordination: Secondary | ICD-10-CM | POA: Diagnosis not present

## 2016-06-11 DIAGNOSIS — M62838 Other muscle spasm: Secondary | ICD-10-CM | POA: Diagnosis not present

## 2016-06-11 DIAGNOSIS — M25539 Pain in unspecified wrist: Secondary | ICD-10-CM | POA: Diagnosis not present

## 2016-06-11 DIAGNOSIS — R102 Pelvic and perineal pain: Secondary | ICD-10-CM | POA: Diagnosis not present

## 2016-06-18 DIAGNOSIS — R102 Pelvic and perineal pain: Secondary | ICD-10-CM | POA: Diagnosis not present

## 2016-06-18 DIAGNOSIS — R278 Other lack of coordination: Secondary | ICD-10-CM | POA: Diagnosis not present

## 2016-06-18 DIAGNOSIS — M62838 Other muscle spasm: Secondary | ICD-10-CM | POA: Diagnosis not present

## 2016-06-18 DIAGNOSIS — M6281 Muscle weakness (generalized): Secondary | ICD-10-CM | POA: Diagnosis not present

## 2016-06-26 DIAGNOSIS — L219 Seborrheic dermatitis, unspecified: Secondary | ICD-10-CM | POA: Diagnosis not present

## 2016-06-26 DIAGNOSIS — M6281 Muscle weakness (generalized): Secondary | ICD-10-CM | POA: Diagnosis not present

## 2016-06-26 DIAGNOSIS — R278 Other lack of coordination: Secondary | ICD-10-CM | POA: Diagnosis not present

## 2016-06-26 DIAGNOSIS — M62838 Other muscle spasm: Secondary | ICD-10-CM | POA: Diagnosis not present

## 2016-06-26 DIAGNOSIS — L570 Actinic keratosis: Secondary | ICD-10-CM | POA: Diagnosis not present

## 2016-06-26 DIAGNOSIS — R3914 Feeling of incomplete bladder emptying: Secondary | ICD-10-CM | POA: Diagnosis not present

## 2016-07-01 DIAGNOSIS — R278 Other lack of coordination: Secondary | ICD-10-CM | POA: Diagnosis not present

## 2016-07-01 DIAGNOSIS — M6281 Muscle weakness (generalized): Secondary | ICD-10-CM | POA: Diagnosis not present

## 2016-07-01 DIAGNOSIS — R109 Unspecified abdominal pain: Secondary | ICD-10-CM | POA: Diagnosis not present

## 2016-07-01 DIAGNOSIS — R3914 Feeling of incomplete bladder emptying: Secondary | ICD-10-CM | POA: Diagnosis not present

## 2016-07-06 DIAGNOSIS — M47816 Spondylosis without myelopathy or radiculopathy, lumbar region: Secondary | ICD-10-CM | POA: Diagnosis not present

## 2016-07-06 DIAGNOSIS — M160 Bilateral primary osteoarthritis of hip: Secondary | ICD-10-CM | POA: Diagnosis not present

## 2016-07-06 DIAGNOSIS — M4727 Other spondylosis with radiculopathy, lumbosacral region: Secondary | ICD-10-CM | POA: Diagnosis not present

## 2016-07-08 DIAGNOSIS — K921 Melena: Secondary | ICD-10-CM | POA: Diagnosis not present

## 2016-07-08 DIAGNOSIS — R1010 Upper abdominal pain, unspecified: Secondary | ICD-10-CM | POA: Diagnosis not present

## 2016-07-09 ENCOUNTER — Encounter: Payer: Self-pay | Admitting: Physician Assistant

## 2016-07-10 DIAGNOSIS — M542 Cervicalgia: Secondary | ICD-10-CM | POA: Diagnosis not present

## 2016-07-10 DIAGNOSIS — M25531 Pain in right wrist: Secondary | ICD-10-CM | POA: Diagnosis not present

## 2016-07-10 DIAGNOSIS — M25532 Pain in left wrist: Secondary | ICD-10-CM | POA: Diagnosis not present

## 2016-07-14 DIAGNOSIS — M62838 Other muscle spasm: Secondary | ICD-10-CM | POA: Diagnosis not present

## 2016-07-14 DIAGNOSIS — R278 Other lack of coordination: Secondary | ICD-10-CM | POA: Diagnosis not present

## 2016-07-14 DIAGNOSIS — M6281 Muscle weakness (generalized): Secondary | ICD-10-CM | POA: Diagnosis not present

## 2016-07-14 DIAGNOSIS — R3914 Feeling of incomplete bladder emptying: Secondary | ICD-10-CM | POA: Diagnosis not present

## 2016-07-15 ENCOUNTER — Encounter (INDEPENDENT_AMBULATORY_CARE_PROVIDER_SITE_OTHER): Payer: Self-pay

## 2016-07-15 ENCOUNTER — Ambulatory Visit (INDEPENDENT_AMBULATORY_CARE_PROVIDER_SITE_OTHER): Payer: BLUE CROSS/BLUE SHIELD | Admitting: Physician Assistant

## 2016-07-15 ENCOUNTER — Encounter: Payer: Self-pay | Admitting: Physician Assistant

## 2016-07-15 VITALS — BP 130/90 | HR 72 | Ht 71.0 in | Wt 241.6 lb

## 2016-07-15 DIAGNOSIS — R194 Change in bowel habit: Secondary | ICD-10-CM

## 2016-07-15 DIAGNOSIS — K219 Gastro-esophageal reflux disease without esophagitis: Secondary | ICD-10-CM

## 2016-07-15 DIAGNOSIS — R140 Abdominal distension (gaseous): Secondary | ICD-10-CM | POA: Diagnosis not present

## 2016-07-15 DIAGNOSIS — R1013 Epigastric pain: Secondary | ICD-10-CM

## 2016-07-15 MED ORDER — NA SULFATE-K SULFATE-MG SULF 17.5-3.13-1.6 GM/177ML PO SOLN: 1.0000 | Freq: Once | ORAL | 0 refills | Status: AC

## 2016-07-15 MED ORDER — OMEPRAZOLE 40 MG PO CPDR: DELAYED_RELEASE_CAPSULE | ORAL | 3 refills | Status: DC

## 2016-07-15 MED ORDER — SUCRALFATE 1 G PO TABS: ORAL_TABLET | ORAL | 1 refills | Status: DC

## 2016-07-15 NOTE — Progress Notes (Addendum)
Chief Complaint: Constipation, GERD  HPI:  Gordon Allen is a 50 year old male with past medical history of abdominal aortic aneurysm, back pain, diabetes, dizziness, DVT, hypertension, shortness of breath and thoracic aortic aneurysm, last echo performed 03/13/2015 showed an LVEF of 55-60%., who was referred to me by Hulan Fess, MD for a complaint of constipation and GERD.   Most recent labs completed on 07/08/16 reveal a CMP with an elevated glucose at 208, lipase was normal at 42, GGT was normal at 31, CBC was within normal limits.   Today, the patient presents to clinic with multiple GI complaints. He tells me that for the past 6 months he has noted that his abdomen is rather "hard", this has been accompanied by a lot of flatulence and bloating as well as "erratic stools". The patient tells me that prior to this time he had 1 bowel movement in the morning every day, but now he may not have a bowel movement in the morning at all followed by hard stools 3 hours later followed by possibly looser stools in the afternoon. They are very "erratic". The patient has been on pain medicine over this time, ue to falling and spraining his wrists. He has also been increasing his fiber intake with fiber gummies and takes MiraLAX when necessary. The patient does not believe that he is "constipated". Patient tells me he has been seeing a physical therapist across the street who has been doing abdominal massage on him to help. It has not changed anything per his report. Patient also reports a "black stool" about a week ago.   Patient also describes a recent increase in a "left-sided chest pain". This pain has occurred twice in the recent past, one this past Sunday night when laying down to sleep, where he felt as though there was a pressure which radiated up his throat. The only thing that seemed to help this was taking a deep breath. This lasted all night into Monday morning. This then occurred again on Wednesday  night into Thursday morning. The patient tells me he also has an epigastric pain which infrequently radiates across his upper abdomen under his diaphragm and is sharp. This is associated with nausea when the patient wakes up. Patient did have Carafate added to his PPI recently, but tells me that over the past couple of weeks he has been taking Carafate and Omeprazole together about a half hour before he eats. He is on Omeprazole 40 mg twice a day and Carafate 1 g twice a day.   Patient reports an EGD and colonoscopy in the past with Dr. Earlean Shawl about 17 years ago or so. He is hoping that we can request results from these.   Patient denies fever, chills, blood in his stool, weight loss, fatigue, anorexia, changes in medication, vomiting or use of NSAIDs.  Past Medical History:  Diagnosis Date  . Abdominal aortic aneurysm ([place])   . Back pain   . Diabetes mellitus without complication ([place])   . Dizziness   . DVT (deep venous thrombosis) ([place])   . Hypertension   . Kidney stone   . Shortness of breath   . Sleep difficulties   . Thoracic aortic aneurysm [facility])     Past Surgical History:  Procedure Laterality Date  . abdominal aorta duplex  01/20/05   No evidence of aneurysmal dilatation. Evidence of aneurysmal dilatation.  [name] ANKLE SURGERY    . DOPPLER ECHOCARDIOGRAPHY  07/09/2008, 01/20/2005   2009- Normal left ventricular size and  function. Normal pattern of diastolic function. No hemodynamic significant valve disease. no evidence of pulmonary hypertension. No significant change compared to prior study from 01/20/2005.  2006- There is borderline concentric left ventricular hypertrophy. Left ventricular systolic function is normal.  . Holter moniter  12/29/2004   The predominant rhythm is Sinus Rhythm with periods of Sinus Tachycardia and Sinus Arrhythmis. Heart Rates: 139 bpm, Minimum: 46 bpm and Mean: 75 bpm.Aberrant Total: 4. Premature ventricular contractions. Atrial total: 25. No Ectopy  . lower  extremity venous doppler  11/26/2008,07/09/2008   Left greater saphenous vein reflux is identified with the caliber ranging from 0.70 cm to 0.46 cm knee to groin. the right and left greater saphenous veins are not aneurysmal. the right and left greater saphenous veins are not tortuous. The deep venous system is competent. The right and left lesser saphenous veins are competent.   2009-- No evidence of arterial insufficiency. Normal values.  [name] NM MYOCAR PERF EJECTION FRACTION  06/13/2008   The post-stress ejection fraction is 58%. No significant ischemia demonstrated. This is a low risk scan. Normal myocardial perfusion imaging.    Current Outpatient Prescriptions  Medication Sig Dispense Refill  . aspirin EC 81 MG tablet Take 81 mg by mouth daily.    [name] atorvastatin (LIPITOR) 20 MG tablet Take 20 mg by mouth daily.    . Beclomethasone Dipropionate (QNASL) 80 MCG/ACT AERS One actuation per nostril twice daily as needed (Patient not taking: Reported on 04/13/2016) 8.7 g 5  . glimepiride (AMARYL) 2 MG tablet Take 1 tablet by mouth daily.  12  . levocetirizine (XYZAL) 5 MG tablet Take 1 tablet (5 mg total) by mouth every evening. (Patient not taking: Reported on 04/13/2016) 30 tablet 5  . lisinopril (PRINIVIL,ZESTRIL) 20 MG tablet Take 20 mg by mouth 2 (two) times daily.    . metFORMIN (GLUCOPHAGE) 1000 MG tablet Take 1,000 mg by mouth 2 (two) times daily with a meal.    . oxymorphone (OPANA) 5 MG tablet Take 5 mg by mouth every 6 (six) hours as needed for pain.    . pantoprazole (PROTONIX) 20 MG tablet Take 20 mg by mouth daily.     . polyethylene glycol (MIRALAX / GLYCOLAX) packet Take 17 g by mouth daily as needed (for constipation).    . triamcinolone (NASACORT AQ) 55 MCG/ACT AERO nasal inhaler Place 1 spray into the nose 2 (two) times daily. (Patient not taking: Reported on 04/13/2016) 16.9 mL 5   No current facility-administered medications for this visit.     Allergies as of 07/15/2016 - Review  Complete 04/13/2016  Allergen Reaction Noted  . Diclofenac sodium  02/27/2013  . Gabapentin  02/27/2013  . Tramadol  02/27/2013  . Oxycodone-acetaminophen Nausea Only 03/08/2015    Family History  Problem Relation Age of Onset  . COPD Mother   . Thyroid disease Father   . Thyroid disease Sister   . Thyroid disease Brother   . Heart attack Maternal Grandmother   . Heart attack Maternal Grandfather     Social History   Social History  . Marital status: Single    Spouse name: N/A  . Number of children: N/A  . Years of education: N/A   Occupational History  . Not on file.   Social History Main Topics  . Smoking status: Never Smoker  . Smokeless tobacco: Not on file  . Alcohol use No  . Drug use: No  . Sexual activity: Not on file   Other Topics  Concern  . Not on file   Social History Narrative  . No narrative on file    Review of Systems:     Constitutional: No weight loss, fever, chills, weakness or fatigue HEENT: Eyes: No change in vision               Ears, Nose, Throat:  No change in hearing or congestion Skin: No rash or itching Cardiovascular: Positive for chest pressure No chest pain or palpitations   Respiratory: No SOB or cough Gastrointestinal: See HPI and otherwise negative Genitourinary: No dysuria or change in urinary frequency Neurological: No headache, dizziness or syncope Musculoskeletal: No new muscle or joint pain Hematologic: No bleeding or bruising Psychiatric: No history of depression or anxiety   Physical Exam:  Vital signs: BP 130/90   Pulse 72   Ht 5\' 11"  (1.803 m)   Wt 241 lb 9.6 oz (109.6 kg)   BMI 33.70 kg/m   General: Caucasian male appears to be in NAD, Well developed, Well nourished, alert and cooperative Head:  Normocephalic and atraumatic. Eyes:   PEERL, EOMI. No icterus. Conjunctiva pink. Ears:  Normal auditory acuity. Neck:  Supple Throat: Oral cavity and pharynx without inflammation, swelling or lesion.  Lungs:  Respirations even and unlabored. Lungs clear to auscultation bilaterally.   No wheezes, crackles, or rhonchi.  Heart: Normal S1, S2. No MRG. Regular rate and rhythm. No peripheral edema, cyanosis or pallor.  Abdomen:  Soft, nondistended, mild generalized TTP. No rebound or guarding. Normal bowel sounds. No appreciable masses or hepatomegaly. Rectal:  Not performed.  Msk:  Symmetrical without gross deformities.  Extremities:  Without edema, no deformity or joint abnormality. Normal ROM. Bilateral wrist braces Neurologic:  Alert and  oriented x4;  grossly normal neurologically. Skin:   Dry and intact without significant lesions or rashes. Psychiatric: Oriented to person, place and time. Somewhat anxious   RELEVANT LABS AND IMAGING: CBC    Component Value Date/Time   WBC 6.5 02/05/2015 2300   RBC 4.48 02/05/2015 2300   HGB 13.1 02/05/2015 2300   HCT 40.3 02/05/2015 2300   PLT 203 02/05/2015 2300   MCV 90.0 02/05/2015 2300   MCH 29.2 02/05/2015 2300   MCHC 32.5 02/05/2015 2300   RDW 13.0 02/05/2015 2300   LYMPHSABS 2.4 02/05/2015 2300   MONOABS 0.5 02/05/2015 2300   EOSABS 0.2 02/05/2015 2300   BASOSABS 0.0 02/05/2015 2300    CMP     Component Value Date/Time   NA 136 02/05/2015 2300   K 3.8 02/05/2015 2300   CL 107 02/05/2015 2300   CO2 24 02/05/2015 2300   GLUCOSE 126 (H) 02/05/2015 2300   BUN 13 02/05/2015 2300   CREATININE 0.87 02/05/2015 2300   CALCIUM 8.7 (L) 02/05/2015 2300   PROT 8.4 (H) 12/22/2012 1522   ALBUMIN 4.3 12/22/2012 1522   AST 22 12/22/2012 1522   ALT 14 12/22/2012 1522   ALKPHOS 75 12/22/2012 1522   BILITOT 0.5 12/22/2012 1522   GFRNONAA >60 02/05/2015 2300   GFRAA >60 02/05/2015 2300    Assessment: 1. Change in bowel habits: Patient describes a change in bowel habits, used to be consistent having 1 bowel movement every morning, now he will have to go the bathroom up to 3 times a day, sometimes not producing anything and other times only a very  small amount and sometimes liquid stool; consider relation to pain medication and constipation, patient does not agree with this necessarily, he would like further evaluation  before changing therapy 2. GERD: Patient describes an epigastric pain that radiates across his abdomen associated with reflux in his throat and nausea in the morning as well as some associated chest pain 3. Epigastric pain: See above 4. Bloating: This and the feeling of "abdominal hardness" that the patient describes are likely related to constipation  Plan: 1. Recommend patient proceed with an EGD and colonoscopy for further evaluation. Discussed risks, benefits, limitations and alternatives and the patient agrees to proceed. 2. Above procedures will be scheduled with Dr. Loletha Carrow in the [facility]. 3. Did request records from patient's last EGD and colonoscopy around 15 years ago 4. Recommend the patient take his Omeprazole 40 mg twice a day, 30-60 minutes before eating. He should take his Carafate one hour before or 2 hours after eating and other medications. Did express that by taking these medications together over the past month or so he has likely not absorbing any of the omeprazole. 5. Did discuss with the patient that I believe his symptoms represent constipation, he does not fully agree with me. Explained that we can give him further recommendations after time of his colonoscopy. 6. Patient to follow in clinic with Dr. Loletha Carrow after time of procedures.  Ellouise Newer, PA-C [facility] 07/15/2016, 2:05 PM  Cc: Hulan Fess, MD   Thank you for sending this case to me. I have reviewed the entire note, and the outlined plan seems appropriate.   Assuming an endoscopic workup is unrevealing, it seems likely that these symptoms are related to chronic opioid use.

## 2016-07-15 NOTE — Patient Instructions (Addendum)
You have been scheduled for an endoscopy and colonoscopy. Please follow the written instructions given to you at your visit today. Please pick up your prep supplies at the pharmacy within the next 1-3 days. [facility] [address]  If you use inhalers (even only as needed), please bring them with you on the day of your procedure. Your physician has requested that you go to www.startemmi.com and enter the access code given to you at your visit today. This web site gives a general overview about your procedure. However, you should still follow specific instructions given to you by our office regarding your preparation for the procedure.  We sent refills for Carafate tablets and Omeprazole 40 mg.

## 2016-07-18 DIAGNOSIS — M25532 Pain in left wrist: Secondary | ICD-10-CM | POA: Diagnosis not present

## 2016-07-20 DIAGNOSIS — L570 Actinic keratosis: Secondary | ICD-10-CM | POA: Diagnosis not present

## 2016-07-27 ENCOUNTER — Telehealth: Payer: Self-pay | Admitting: Gastroenterology

## 2016-07-27 DIAGNOSIS — M542 Cervicalgia: Secondary | ICD-10-CM | POA: Diagnosis not present

## 2016-07-27 DIAGNOSIS — M160 Bilateral primary osteoarthritis of hip: Secondary | ICD-10-CM | POA: Diagnosis not present

## 2016-07-27 DIAGNOSIS — M25531 Pain in right wrist: Secondary | ICD-10-CM | POA: Diagnosis not present

## 2016-07-27 DIAGNOSIS — M25532 Pain in left wrist: Secondary | ICD-10-CM | POA: Diagnosis not present

## 2016-07-27 NOTE — Telephone Encounter (Signed)
Tried to call the patient. No answer and mailbox was full and had no way to leave a message. Faxed a pay no more than 50 coupon to pharmacy listed in chart.

## 2016-07-28 ENCOUNTER — Encounter: Payer: Self-pay | Admitting: Gastroenterology

## 2016-07-28 ENCOUNTER — Ambulatory Visit (AMBULATORY_SURGERY_CENTER): Payer: BLUE CROSS/BLUE SHIELD | Admitting: Gastroenterology

## 2016-07-28 VITALS — BP 135/75 | HR 56 | Temp 99.1°F | Resp 14 | Ht 71.0 in | Wt 241.0 lb

## 2016-07-28 DIAGNOSIS — R1013 Epigastric pain: Secondary | ICD-10-CM | POA: Diagnosis not present

## 2016-07-28 DIAGNOSIS — R194 Change in bowel habit: Secondary | ICD-10-CM

## 2016-07-28 LAB — GLUCOSE, CAPILLARY
GLUCOSE-CAPILLARY: 165 mg/dL — AB (ref 65–99)
Glucose-Capillary: 154 mg/dL — ABNORMAL HIGH (ref 65–99)

## 2016-07-28 MED ORDER — SODIUM CHLORIDE 0.9 % IV SOLN: 500.0000 mL | INTRAVENOUS | Status: DC

## 2016-07-28 MED ORDER — METRONIDAZOLE 500 MG PO TABS: 500.0000 mg | ORAL_TABLET | Freq: Three times a day (TID) | ORAL | 0 refills | Status: DC

## 2016-07-28 NOTE — Op Note (Signed)
[place] Patient Name: Gordon Allen Procedure Date: 07/28/2016 2:27 PM MRN: [id number] Endoscopist: Mallie Mussel L. Loletha Carrow , MD Age: 50 Referring MD:  Date of Birth: 29-Dec-1965 Gender: Male Account #: [id number] Procedure:                Upper GI endoscopy Indications:              Abdominal bloating, Chest pain (non cardiac) Medicines:                Monitored Anesthesia Care Procedure:                Pre-Anesthesia Assessment:                           - Prior to the procedure, a History and Physical                            was performed, and patient medications and                            allergies were reviewed. The patient's tolerance of                            previous anesthesia was also reviewed. The risks                            and benefits of the procedure and the sedation                            options and risks were discussed with the patient.                            All questions were answered, and informed consent                            was obtained. Anticoagulants: The patient has taken                            aspirin. It was decided not to withhold this                            medication prior to the procedure. ASA Grade                            Assessment: II - A patient with mild systemic                            disease. After reviewing the risks and benefits,                            the patient was deemed in satisfactory condition to                            undergo the procedure.  After obtaining informed consent, the endoscope was                            passed under direct vision. Throughout the                            procedure, the patient's blood pressure, pulse, and                            oxygen saturations were monitored continuously. The                            Model GIF-HQ190 [id number]) scope was introduced                            through the mouth, and advanced  to the second part                            of duodenum. The upper GI endoscopy was                            accomplished without difficulty. The patient                            tolerated the procedure well. Scope In: Scope Out: Findings:                 The esophagus was normal.                           The stomach was normal.                           The cardia and gastric fundus were normal on                            retroflexion.                           The examined duodenum was normal. Complications:            No immediate complications. Estimated Blood Loss:     Estimated blood loss: none. Impression:               - Normal esophagus.                           - Normal stomach.                           - Normal examined duodenum.                           - No specimens collected. Recommendation:           - Patient has a contact number available for  emergencies. The signs and symptoms of potential                            delayed complications were discussed with the                            patient. Return to normal activities tomorrow.                            Written discharge instructions were provided to the                            patient.                           - Resume previous diet.                           - Continue present medications.                           - See the other procedure note for documentation of                            additional recommendations. Wileen Duncanson L. Loletha Carrow, MD 07/28/2016 3:05:30 PM This report has been signed electronically.

## 2016-07-28 NOTE — Patient Instructions (Signed)
YOU HAD AN ENDOSCOPIC PROCEDURE TODAY AT [facility]:   Refer to the procedure report that was given to you for any specific questions about what was found during the examination.  If the procedure report does not answer your questions, please call your gastroenterologist to clarify.  If you requested that your care partner not be given the details of your procedure findings, then the procedure report has been included in a sealed envelope for you to review at your convenience later.  YOU SHOULD EXPECT: Some feelings of bloating in the abdomen. Passage of more gas than usual.  Walking can help get rid of the air that was put into your GI tract during the procedure and reduce the bloating. If you had a lower endoscopy (such as a colonoscopy or flexible sigmoidoscopy) you may notice spotting of blood in your stool or on the toilet paper. If you underwent a bowel prep for your procedure, you may not have a normal bowel movement for a few days.  Please Note:  You might notice some irritation and congestion in your nose or some drainage.  This is from the oxygen used during your procedure.  There is no need for concern and it should clear up in a day or so.  SYMPTOMS TO REPORT IMMEDIATELY:   Following lower endoscopy (colonoscopy or flexible sigmoidoscopy):  Excessive amounts of blood in the stool  Significant tenderness or worsening of abdominal pains  Swelling of the abdomen that is new, acute  Fever of 100F or higher   For urgent or emergent issues, a gastroenterologist can be reached at any hour by calling [phone].   DIET:  We do recommend a small meal at first, but then you may proceed to your regular diet.  Drink plenty of fluids but you should avoid alcoholic beverages for 24 hours.  ACTIVITY:  You should plan to take it easy for the rest of today and you should NOT DRIVE or use heavy machinery until tomorrow (because of the sedation medicines used during the test).     FOLLOW UP: Our staff will call the number listed on your records the next business day following your procedure to check on you and address any questions or concerns that you may have regarding the information given to you following your procedure. If we do not reach you, we will leave a message.  However, if you are feeling well and you are not experiencing any problems, there is no need to return our call.  We will assume that you have returned to your regular daily activities without incident.  If any biopsies were taken you will be contacted by phone or by letter within the next 1-3 weeks.  Please call us at [phone] if you have not heard about the biopsies in 3 weeks.    SIGNATURES/CONFIDENTIALITY: You and/or your care partner have signed paperwork which will be entered into your electronic medical record.  These signatures attest to the fact that that the information above on your After Visit Summary has been reviewed and is understood.  Full responsibility of the confidentiality of this discharge information lies with you and/or your care-partner.  Take the new drug metronidazole 500mg  three times a day per Dr. Loletha Carrow.   This is a 7 day prescription.   Read all of the handouts given to you by your recovery room nurse.  Thank-you for choosing [place] for your healthcare needs today.

## 2016-07-28 NOTE — Progress Notes (Signed)
To recovery, report to [place], RN, VSS 

## 2016-07-28 NOTE — Op Note (Signed)
[facility] Patient Name: Gordon Allen Procedure Date: 07/28/2016 2:26 PM MRN: [id number] Endoscopist: Mallie Mussel L. Loletha Carrow , MD Age: 50 Referring MD:  Date of Birth: 09-16-1965 Gender: Male Account #: [id number] Procedure:                Colonoscopy Indications:              Generalized abdominal pain, Bloating Medicines:                Monitored Anesthesia Care Procedure:                Pre-Anesthesia Assessment:                           - Prior to the procedure, a History and Physical                            was performed, and patient medications and                            allergies were reviewed. The patient's tolerance of                            previous anesthesia was also reviewed. The risks                            and benefits of the procedure and the sedation                            options and risks were discussed with the patient.                            All questions were answered, and informed consent                            was obtained. Anticoagulants: The patient has taken                            aspirin. It was decided not to withhold this                            medication prior to the procedure. ASA Grade                            Assessment: II - A patient with mild systemic                            disease. After reviewing the risks and benefits,                            the patient was deemed in satisfactory condition to                            undergo the procedure.  After obtaining informed consent, the colonoscope                            was passed under direct vision. Throughout the                            procedure, the patient's blood pressure, pulse, and                            oxygen saturations were monitored continuously. The                            Model CF-HQ190L [id number]) scope was introduced                            through the anus and advanced to the the  terminal                            ileum. The colonoscopy was performed without                            difficulty. The patient tolerated the procedure                            well. The quality of the bowel preparation was                            excellent. The terminal ileum, ileocecal valve,                            appendiceal orifice, and rectum were photographed.                            The quality of the bowel preparation was evaluated                            using the BBPS [facility] Bowel Preparation Scale)                            with scores of: Right Colon = 3, Transverse Colon =                            3 and Left Colon = 3 (entire mucosa seen well with                            no residual staining, small fragments of stool or                            opaque liquid). The total BBPS score equals 9. The                            bowel preparation used was SUPREP. Scope In: 2:52:10 PM Scope Out: 3:01:11 PM Scope  Withdrawal Time: 0 hours 6 minutes 51 seconds  Total Procedure Duration: 0 hours 9 minutes 1 second  Findings:                 The perianal and digital rectal examinations were                            normal.                           The terminal ileum appeared normal.                           Retroflexion in the rectum was not performed due to                            anatomy.                           The colon (entire examined portion) appeared normal. Complications:            No immediate complications. Estimated Blood Loss:     Estimated blood loss: none. Impression:               - The examined portion of the ileum was normal.                           - The entire examined colon is normal.                           - No specimens collected. Recommendation:           - Patient has a contact number available for                            emergencies. The signs and symptoms of potential                            delayed  complications were discussed with the                            patient. Return to normal activities tomorrow.                            Written discharge instructions were provided to the                            patient.                           - Resume previous diet.                           - Continue present medications except discontinue                            carafate.                           -  Repeat colonoscopy in 10 years for screening                            purposes.                           - metronidazole 500 mg three times daily for 7 days                            in case patient developed small bowel bacterial                            overgrowth from prior use of opiod analgesic.                           Disp #[id number], RF zero Gordon Allen L. Loletha Carrow, MD 07/28/2016 3:11:55 PM This report has been signed electronically.

## 2016-07-29 ENCOUNTER — Telehealth: Payer: Self-pay | Admitting: *Deleted

## 2016-07-29 NOTE — Telephone Encounter (Signed)
Attempted to contact pt,unable to leave message voicemail full.

## 2016-07-30 ENCOUNTER — Telehealth: Payer: Self-pay

## 2016-07-30 DIAGNOSIS — M542 Cervicalgia: Secondary | ICD-10-CM | POA: Diagnosis not present

## 2016-07-30 NOTE — Telephone Encounter (Signed)
  Follow up Call-  Call back number 07/28/2016  Post procedure Call Back phone  # [phone]  Permission to leave phone message Yes  Some recent data might be hidden     Patient was called for follow up after his procedure on 07/28/2016. No answer at the number given for follow up phone call. I was not able to leave a message due to mail box full.

## 2016-07-31 DIAGNOSIS — S63592A Other specified sprain of left wrist, initial encounter: Secondary | ICD-10-CM | POA: Diagnosis not present

## 2016-08-13 DIAGNOSIS — M542 Cervicalgia: Secondary | ICD-10-CM | POA: Diagnosis not present

## 2016-08-14 DIAGNOSIS — S63592D Other specified sprain of left wrist, subsequent encounter: Secondary | ICD-10-CM | POA: Diagnosis not present

## 2016-08-14 DIAGNOSIS — L219 Seborrheic dermatitis, unspecified: Secondary | ICD-10-CM | POA: Diagnosis not present

## 2016-08-14 DIAGNOSIS — L570 Actinic keratosis: Secondary | ICD-10-CM | POA: Diagnosis not present

## 2016-08-19 DIAGNOSIS — R3914 Feeling of incomplete bladder emptying: Secondary | ICD-10-CM | POA: Diagnosis not present

## 2016-08-19 DIAGNOSIS — R351 Nocturia: Secondary | ICD-10-CM | POA: Diagnosis not present

## 2016-08-19 DIAGNOSIS — M62838 Other muscle spasm: Secondary | ICD-10-CM | POA: Diagnosis not present

## 2016-08-19 DIAGNOSIS — R109 Unspecified abdominal pain: Secondary | ICD-10-CM | POA: Diagnosis not present

## 2016-09-01 DIAGNOSIS — M542 Cervicalgia: Secondary | ICD-10-CM | POA: Diagnosis not present

## 2016-09-01 DIAGNOSIS — Z6833 Body mass index (BMI) 33.0-33.9, adult: Secondary | ICD-10-CM | POA: Diagnosis not present

## 2016-09-01 DIAGNOSIS — I10 Essential (primary) hypertension: Secondary | ICD-10-CM | POA: Diagnosis not present

## 2016-09-02 DIAGNOSIS — M6281 Muscle weakness (generalized): Secondary | ICD-10-CM | POA: Diagnosis not present

## 2016-09-02 DIAGNOSIS — R109 Unspecified abdominal pain: Secondary | ICD-10-CM | POA: Diagnosis not present

## 2016-09-02 DIAGNOSIS — M62838 Other muscle spasm: Secondary | ICD-10-CM | POA: Diagnosis not present

## 2016-09-02 DIAGNOSIS — R3914 Feeling of incomplete bladder emptying: Secondary | ICD-10-CM | POA: Diagnosis not present

## 2016-09-04 DIAGNOSIS — M542 Cervicalgia: Secondary | ICD-10-CM | POA: Diagnosis not present

## 2016-09-10 DIAGNOSIS — N5201 Erectile dysfunction due to arterial insufficiency: Secondary | ICD-10-CM | POA: Diagnosis not present

## 2016-09-10 DIAGNOSIS — R351 Nocturia: Secondary | ICD-10-CM | POA: Diagnosis not present

## 2016-09-10 DIAGNOSIS — R350 Frequency of micturition: Secondary | ICD-10-CM | POA: Diagnosis not present

## 2016-09-10 DIAGNOSIS — R102 Pelvic and perineal pain: Secondary | ICD-10-CM | POA: Diagnosis not present

## 2016-09-10 DIAGNOSIS — M542 Cervicalgia: Secondary | ICD-10-CM | POA: Diagnosis not present

## 2016-09-14 DIAGNOSIS — M542 Cervicalgia: Secondary | ICD-10-CM | POA: Diagnosis not present

## 2016-09-22 DIAGNOSIS — S63592D Other specified sprain of left wrist, subsequent encounter: Secondary | ICD-10-CM | POA: Diagnosis not present

## 2016-09-23 DIAGNOSIS — R109 Unspecified abdominal pain: Secondary | ICD-10-CM | POA: Diagnosis not present

## 2016-09-23 DIAGNOSIS — R351 Nocturia: Secondary | ICD-10-CM | POA: Diagnosis not present

## 2016-09-23 DIAGNOSIS — M6281 Muscle weakness (generalized): Secondary | ICD-10-CM | POA: Diagnosis not present

## 2016-09-23 DIAGNOSIS — M542 Cervicalgia: Secondary | ICD-10-CM | POA: Diagnosis not present

## 2016-09-23 DIAGNOSIS — M62838 Other muscle spasm: Secondary | ICD-10-CM | POA: Diagnosis not present

## 2016-09-29 DIAGNOSIS — M542 Cervicalgia: Secondary | ICD-10-CM | POA: Diagnosis not present

## 2016-09-30 DIAGNOSIS — R300 Dysuria: Secondary | ICD-10-CM | POA: Diagnosis not present

## 2016-09-30 DIAGNOSIS — R109 Unspecified abdominal pain: Secondary | ICD-10-CM | POA: Diagnosis not present

## 2016-09-30 DIAGNOSIS — M6281 Muscle weakness (generalized): Secondary | ICD-10-CM | POA: Diagnosis not present

## 2016-09-30 DIAGNOSIS — M62838 Other muscle spasm: Secondary | ICD-10-CM | POA: Diagnosis not present

## 2016-10-01 DIAGNOSIS — M542 Cervicalgia: Secondary | ICD-10-CM | POA: Diagnosis not present

## 2016-10-06 DIAGNOSIS — M542 Cervicalgia: Secondary | ICD-10-CM | POA: Diagnosis not present

## 2016-10-08 DIAGNOSIS — M542 Cervicalgia: Secondary | ICD-10-CM | POA: Diagnosis not present

## 2016-10-13 DIAGNOSIS — M542 Cervicalgia: Secondary | ICD-10-CM | POA: Diagnosis not present

## 2016-10-13 DIAGNOSIS — S63592D Other specified sprain of left wrist, subsequent encounter: Secondary | ICD-10-CM | POA: Diagnosis not present

## 2016-10-14 DIAGNOSIS — S63592D Other specified sprain of left wrist, subsequent encounter: Secondary | ICD-10-CM | POA: Diagnosis not present

## 2016-10-15 DIAGNOSIS — M542 Cervicalgia: Secondary | ICD-10-CM | POA: Diagnosis not present

## 2016-10-19 DIAGNOSIS — M542 Cervicalgia: Secondary | ICD-10-CM | POA: Diagnosis not present

## 2016-10-20 DIAGNOSIS — S63592D Other specified sprain of left wrist, subsequent encounter: Secondary | ICD-10-CM | POA: Diagnosis not present

## 2016-10-22 DIAGNOSIS — M542 Cervicalgia: Secondary | ICD-10-CM | POA: Diagnosis not present

## 2016-10-23 DIAGNOSIS — S63592D Other specified sprain of left wrist, subsequent encounter: Secondary | ICD-10-CM | POA: Diagnosis not present

## 2016-10-26 DIAGNOSIS — L219 Seborrheic dermatitis, unspecified: Secondary | ICD-10-CM | POA: Diagnosis not present

## 2016-10-26 DIAGNOSIS — L719 Rosacea, unspecified: Secondary | ICD-10-CM | POA: Diagnosis not present

## 2016-10-26 DIAGNOSIS — S63592D Other specified sprain of left wrist, subsequent encounter: Secondary | ICD-10-CM | POA: Diagnosis not present

## 2016-10-27 DIAGNOSIS — M62838 Other muscle spasm: Secondary | ICD-10-CM | POA: Diagnosis not present

## 2016-10-27 DIAGNOSIS — R109 Unspecified abdominal pain: Secondary | ICD-10-CM | POA: Diagnosis not present

## 2016-10-27 DIAGNOSIS — M6281 Muscle weakness (generalized): Secondary | ICD-10-CM | POA: Diagnosis not present

## 2016-10-27 DIAGNOSIS — M542 Cervicalgia: Secondary | ICD-10-CM | POA: Diagnosis not present

## 2016-10-27 DIAGNOSIS — R300 Dysuria: Secondary | ICD-10-CM | POA: Diagnosis not present

## 2016-10-29 DIAGNOSIS — S63592D Other specified sprain of left wrist, subsequent encounter: Secondary | ICD-10-CM | POA: Diagnosis not present

## 2016-11-02 DIAGNOSIS — S63592D Other specified sprain of left wrist, subsequent encounter: Secondary | ICD-10-CM | POA: Diagnosis not present

## 2016-11-03 DIAGNOSIS — M542 Cervicalgia: Secondary | ICD-10-CM | POA: Diagnosis not present

## 2016-11-05 DIAGNOSIS — M542 Cervicalgia: Secondary | ICD-10-CM | POA: Diagnosis not present

## 2016-11-05 DIAGNOSIS — S63592D Other specified sprain of left wrist, subsequent encounter: Secondary | ICD-10-CM | POA: Diagnosis not present

## 2016-11-10 DIAGNOSIS — S63592D Other specified sprain of left wrist, subsequent encounter: Secondary | ICD-10-CM | POA: Diagnosis not present

## 2016-11-10 DIAGNOSIS — M654 Radial styloid tenosynovitis [de Quervain]: Secondary | ICD-10-CM | POA: Diagnosis not present

## 2016-11-17 DIAGNOSIS — M542 Cervicalgia: Secondary | ICD-10-CM | POA: Diagnosis not present

## 2016-11-20 DIAGNOSIS — M654 Radial styloid tenosynovitis [de Quervain]: Secondary | ICD-10-CM | POA: Diagnosis not present

## 2016-11-20 DIAGNOSIS — M25531 Pain in right wrist: Secondary | ICD-10-CM | POA: Diagnosis not present

## 2016-11-20 DIAGNOSIS — M25532 Pain in left wrist: Secondary | ICD-10-CM | POA: Diagnosis not present

## 2016-11-20 DIAGNOSIS — S63592D Other specified sprain of left wrist, subsequent encounter: Secondary | ICD-10-CM | POA: Diagnosis not present

## 2016-11-23 DIAGNOSIS — M160 Bilateral primary osteoarthritis of hip: Secondary | ICD-10-CM | POA: Diagnosis not present

## 2016-11-23 DIAGNOSIS — M4727 Other spondylosis with radiculopathy, lumbosacral region: Secondary | ICD-10-CM | POA: Diagnosis not present

## 2016-11-23 DIAGNOSIS — I10 Essential (primary) hypertension: Secondary | ICD-10-CM | POA: Diagnosis not present

## 2016-11-23 DIAGNOSIS — M542 Cervicalgia: Secondary | ICD-10-CM | POA: Diagnosis not present

## 2016-12-01 DIAGNOSIS — M542 Cervicalgia: Secondary | ICD-10-CM | POA: Diagnosis not present

## 2016-12-03 DIAGNOSIS — M542 Cervicalgia: Secondary | ICD-10-CM | POA: Diagnosis not present

## 2016-12-07 ENCOUNTER — Other Ambulatory Visit: Payer: Self-pay | Admitting: Physician Assistant

## 2016-12-07 DIAGNOSIS — M542 Cervicalgia: Secondary | ICD-10-CM | POA: Diagnosis not present

## 2016-12-07 NOTE — Telephone Encounter (Signed)
This pt seen Anderson Malta last November. Are you ok to send in refill?

## 2016-12-07 NOTE — Telephone Encounter (Signed)
Gordon Allen I think this patient belongs to Dr. Loletha Carrow, if you can forward to him. He is is out of the office I can take care of it. Thanks

## 2016-12-08 DIAGNOSIS — M6281 Muscle weakness (generalized): Secondary | ICD-10-CM | POA: Diagnosis not present

## 2016-12-08 DIAGNOSIS — R109 Unspecified abdominal pain: Secondary | ICD-10-CM | POA: Diagnosis not present

## 2016-12-08 DIAGNOSIS — R3914 Feeling of incomplete bladder emptying: Secondary | ICD-10-CM | POA: Diagnosis not present

## 2016-12-08 DIAGNOSIS — R300 Dysuria: Secondary | ICD-10-CM | POA: Diagnosis not present

## 2016-12-11 DIAGNOSIS — S93422A Sprain of deltoid ligament of left ankle, initial encounter: Secondary | ICD-10-CM | POA: Diagnosis not present

## 2016-12-11 DIAGNOSIS — M654 Radial styloid tenosynovitis [de Quervain]: Secondary | ICD-10-CM | POA: Diagnosis not present

## 2016-12-11 DIAGNOSIS — S63592D Other specified sprain of left wrist, subsequent encounter: Secondary | ICD-10-CM | POA: Diagnosis not present

## 2016-12-14 DIAGNOSIS — M25572 Pain in left ankle and joints of left foot: Secondary | ICD-10-CM | POA: Diagnosis not present

## 2016-12-14 DIAGNOSIS — M19072 Primary osteoarthritis, left ankle and foot: Secondary | ICD-10-CM | POA: Diagnosis not present

## 2016-12-26 DIAGNOSIS — R0602 Shortness of breath: Secondary | ICD-10-CM | POA: Diagnosis not present

## 2016-12-26 DIAGNOSIS — R062 Wheezing: Secondary | ICD-10-CM | POA: Diagnosis not present

## 2016-12-26 DIAGNOSIS — R0982 Postnasal drip: Secondary | ICD-10-CM | POA: Diagnosis not present

## 2016-12-28 ENCOUNTER — Telehealth: Payer: Self-pay | Admitting: Cardiovascular Disease

## 2016-12-28 ENCOUNTER — Encounter (HOSPITAL_COMMUNITY): Payer: Self-pay | Admitting: Family Medicine

## 2016-12-28 ENCOUNTER — Emergency Department (HOSPITAL_COMMUNITY): Payer: BLUE CROSS/BLUE SHIELD

## 2016-12-28 ENCOUNTER — Emergency Department (HOSPITAL_COMMUNITY)
Admission: EM | Admit: 2016-12-28 | Discharge: 2016-12-28 | Disposition: A | Discharge status: Home / Self Care | Payer: BLUE CROSS/BLUE SHIELD | Attending: Emergency Medicine | Admitting: Emergency Medicine

## 2016-12-28 DIAGNOSIS — I10 Essential (primary) hypertension: Secondary | ICD-10-CM | POA: Insufficient documentation

## 2016-12-28 DIAGNOSIS — Y939 Activity, unspecified: Secondary | ICD-10-CM | POA: Diagnosis not present

## 2016-12-28 DIAGNOSIS — Y92410 Unspecified street and highway as the place of occurrence of the external cause: Secondary | ICD-10-CM | POA: Diagnosis not present

## 2016-12-28 DIAGNOSIS — Z7984 Long term (current) use of oral hypoglycemic drugs: Secondary | ICD-10-CM | POA: Insufficient documentation

## 2016-12-28 DIAGNOSIS — Z7982 Long term (current) use of aspirin: Secondary | ICD-10-CM | POA: Diagnosis not present

## 2016-12-28 DIAGNOSIS — T148XXA Other injury of unspecified body region, initial encounter: Secondary | ICD-10-CM | POA: Diagnosis not present

## 2016-12-28 DIAGNOSIS — M25511 Pain in right shoulder: Secondary | ICD-10-CM | POA: Insufficient documentation

## 2016-12-28 DIAGNOSIS — R51 Headache: Secondary | ICD-10-CM | POA: Insufficient documentation

## 2016-12-28 DIAGNOSIS — E119 Type 2 diabetes mellitus without complications: Secondary | ICD-10-CM | POA: Diagnosis not present

## 2016-12-28 DIAGNOSIS — Y999 Unspecified external cause status: Secondary | ICD-10-CM | POA: Insufficient documentation

## 2016-12-28 MED ORDER — NAPROXEN 500 MG PO TABS
500.0000 mg | ORAL_TABLET | Freq: Two times a day (BID) | ORAL | 0 refills | Status: DC
Start: 2016-12-28 — End: 2017-01-06

## 2016-12-28 MED ORDER — METHOCARBAMOL 500 MG PO TABS: 500.0000 mg | ORAL_TABLET | Freq: Every evening | ORAL | 0 refills | Status: DC | PRN

## 2016-12-28 NOTE — Telephone Encounter (Signed)
Mailbox is full unable to leave message-will try later
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
## 2016-12-28 NOTE — Telephone Encounter (Signed)
New message    Pt c/o swelling: STAT is pt has developed SOB within 24 hours  1. How long have you been experiencing swelling? 2 weeks-states he has gained 13 lbs in a month  2. Where is the swelling located? Ankles  3.  Are you currently taking a "fluid pill"? No  4.  Are you currently SOB? Pt states he is having sob when he lays down. Went to walk in clinic and was diagnosed with bronchitis   5.  Have you traveled recently? No
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
## 2016-12-29 NOTE — Telephone Encounter (Signed)
Patient has been seen in the ED

## 2016-12-31 DIAGNOSIS — M542 Cervicalgia: Secondary | ICD-10-CM | POA: Diagnosis not present

## 2016-12-31 DIAGNOSIS — J329 Chronic sinusitis, unspecified: Secondary | ICD-10-CM | POA: Diagnosis not present

## 2016-12-31 DIAGNOSIS — R600 Localized edema: Secondary | ICD-10-CM | POA: Diagnosis not present

## 2017-01-05 DIAGNOSIS — I82811 Embolism and thrombosis of superficial veins of right lower extremities: Secondary | ICD-10-CM | POA: Diagnosis not present

## 2017-01-05 DIAGNOSIS — M542 Cervicalgia: Secondary | ICD-10-CM | POA: Diagnosis not present

## 2017-01-06 ENCOUNTER — Encounter: Payer: Self-pay | Admitting: Cardiovascular Disease

## 2017-01-06 ENCOUNTER — Ambulatory Visit (INDEPENDENT_AMBULATORY_CARE_PROVIDER_SITE_OTHER)
Admission: RE | Admit: 2017-01-06 | Discharge: 2017-01-06 | Disposition: A | Discharge status: Home / Self Care | Payer: BLUE CROSS/BLUE SHIELD | Source: Ambulatory Visit | Attending: Cardiovascular Disease | Admitting: Cardiovascular Disease

## 2017-01-06 ENCOUNTER — Other Ambulatory Visit: Payer: BLUE CROSS/BLUE SHIELD | Admitting: *Deleted

## 2017-01-06 ENCOUNTER — Ambulatory Visit (HOSPITAL_COMMUNITY)
Admission: RE | Admit: 2017-01-06 | Discharge: 2017-01-06 | Disposition: A | Discharge status: Home / Self Care | Payer: BLUE CROSS/BLUE SHIELD | Source: Ambulatory Visit | Attending: Cardiology | Admitting: Cardiology

## 2017-01-06 ENCOUNTER — Ambulatory Visit (INDEPENDENT_AMBULATORY_CARE_PROVIDER_SITE_OTHER): Payer: BLUE CROSS/BLUE SHIELD | Admitting: Cardiovascular Disease

## 2017-01-06 VITALS — BP 154/90 | HR 66 | Ht 71.0 in | Wt 240.0 lb

## 2017-01-06 DIAGNOSIS — R0600 Dyspnea, unspecified: Secondary | ICD-10-CM

## 2017-01-06 DIAGNOSIS — R0602 Shortness of breath: Secondary | ICD-10-CM

## 2017-01-06 DIAGNOSIS — I10 Essential (primary) hypertension: Secondary | ICD-10-CM

## 2017-01-06 DIAGNOSIS — I82401 Acute embolism and thrombosis of unspecified deep veins of right lower extremity: Secondary | ICD-10-CM | POA: Insufficient documentation

## 2017-01-06 LAB — BASIC METABOLIC PANEL
BUN/Creatinine Ratio: 12 (ref 9–20)
BUN: 10 mg/dL (ref 6–24)
CO2: 29 mmol/L (ref 18–29)
CREATININE: 0.85 mg/dL (ref 0.76–1.27)
Calcium: 9.7 mg/dL (ref 8.7–10.2)
Chloride: 101 mmol/L (ref 96–106)
GFR calc Af Amer: 118 mL/min/{1.73_m2} (ref >59)
GFR, EST NON AFRICAN AMERICAN: 102 mL/min/{1.73_m2} (ref >59)
Glucose: 167 mg/dL — ABNORMAL HIGH (ref 65–99)
Potassium: 4.1 mmol/L (ref 3.5–5.2)
Sodium: 136 mmol/L (ref 134–144)

## 2017-01-06 MED ORDER — IOPAMIDOL (ISOVUE-370) INJECTION 76%
80.0000 mL | Freq: Once | INTRAVENOUS | Status: AC | PRN
  Administered 2017-01-06: 80 mL via INTRAVENOUS

## 2017-01-06 NOTE — Assessment & Plan Note (Signed)
History of hypertension with blood pressure measured at 154/90. He is on lisinopril. Continue current meds at current dosing

## 2017-01-06 NOTE — Patient Instructions (Signed)
Medication Instructions: Your physician recommends that you continue on your current medications as directed. Please refer to the Current Medication list given to you today.   Labwork: Your physician recommends that you return for lab work today: BMET   Testing/Procedures:  Your physician has requested that you have a lower extremity venous duplex. This test is an ultrasound of the veins in the legs or arms. It looks at venous blood flow that carries blood from the heart to the legs or arms. Allow one hour for a Lower Venous exam. Allow thirty minutes for an Upper Venous exam. There are no restrictions or special instructions.  Non-Cardiac CT Angiography (CTA)--Chest, is a special type of CT scan that uses a computer to produce multi-dimensional views of major blood vessels throughout the body. In CT angiography, a contrast material is injected through an IV to help visualize the blood vessels  Your physician has requested that you have an echocardiogram. Echocardiography is a painless test that uses sound waves to create images of your heart. It provides your doctor with information about the size and shape of your heart and how well your heart's chambers and valves are working. This procedure takes approximately one hour. There are no restrictions for this procedure.  Follow-Up: Your physician recommends that you schedule a follow-up appointment in 3 months with Dr. Gwenlyn Found unless positive results.   If you need a refill on your cardiac medications before your next appointment, please call your pharmacy.

## 2017-01-06 NOTE — Assessment & Plan Note (Signed)
History of recent shortness of breath with orthopnea. He also saw his primary care physician who obtained a d-dimer that was minimally elevated which led to a venous duplex study revealed right superficial femoral vein DVT. I'm going to repeat his venous study today to assess whether or not he needs a prolonged oral anticoagulants and order a chest CT to rule out pulmonary embolus

## 2017-01-06 NOTE — Assessment & Plan Note (Signed)
History of right popliteal DVT back in 2013 on Xarelto for 18 months after that. He apparently had a venous Doppler done yesterday that showed a right distal superficial femoral vein thrombus. I'm going to rescan his right lower extremity today to be able to make a decision regarding initiation of oral anticoagulation.

## 2017-01-06 NOTE — Progress Notes (Signed)
01/06/2017 Khadar Monger Penza   [birth year]  [id number]  Primary Physician Hulan Fess, MD Primary Cardiologist: Lorretta Harp MD Renae Gloss  HPI:  Mr.Offord is a 51 year old moderately overweight single Caucasian male patient of Dr. Aurther Loft who was formally a patient of Dr. Merri Ray. I last saw him in the hot office 07/19/15. He has a history of treated hypertension, diabetes and hyperlipidemia. He did have a right popliteal DVT back in 2013 about this on oral after graduation for 18 months which was then stopped. He apparently had a thrombophilia workup and was not told that he had any predisposition for this clotting. He had negative stress test back in 2009. He had a pneumonia treated approximately 2 months ago and about X and presented to [facility] over the summer with shortness of breath and dizziness. A chest CT was unrevealing. Since I saw him back [place] and well until recently. He was complaining of some orthopnea and some swelling of his lower extremities. He saw his PCP who will order blood work including a d-dimer was minimally elevated. This led to a venous duplex study yesterday that showed right superficial femoral vein DVT. He has also been complaining of some lower extremity edema right greater than left which has since resolved. A 2-D echocardiogram performed 03/13/15 was essentially normal.  Current Outpatient Prescriptions  Medication Sig Dispense Refill  . aspirin EC 81 MG tablet Take 81 mg by mouth daily.    [name] atorvastatin (LIPITOR) 20 MG tablet Take 20 mg by mouth daily.    [name] glimepiride (AMARYL) 2 MG tablet Take 1 tablet by mouth 2 (two) times daily.   12  . lisinopril (PRINIVIL,ZESTRIL) 20 MG tablet Take 20 mg by mouth 2 (two) times daily.    . metFORMIN (GLUCOPHAGE) 1000 MG tablet Take 1,000 mg by mouth 2 (two) times daily with a meal.    . omeprazole (PRILOSEC) 40 MG capsule Take 1 capsule (40 mg total) by mouth daily. 30  capsule 3  . oxymorphone (OPANA) 5 MG tablet Take 5 mg by mouth every 6 (six) hours as needed for pain.    . polyethylene glycol (MIRALAX / GLYCOLAX) packet Take 17 g by mouth daily as needed (for constipation).     Current Facility-Administered Medications  Medication Dose Route Frequency Provider Last Rate Last Dose  . 0.9 %  sodium chloride infusion  500 mL Intravenous Continuous Doran Stabler, MD        Allergies  Allergen Reactions  . Diclofenac Sodium     Increased gas  . Gabapentin     Severe diarrhea  . Tramadol     nausea  . Oxycodone-Acetaminophen Nausea Only    Social History   Social History  . Marital status: Single    Spouse name: N/A  . Number of children: N/A  . Years of education: N/A   Occupational History  . Not on file.   Social History Main Topics  . Smoking status: Never Smoker  . Smokeless tobacco: Never Used  . Alcohol use No  . Drug use: No  . Sexual activity: Not on file   Other Topics Concern  . Not on file   Social History Narrative  . No narrative on file     Review of Systems: General: negative for chills, fever, night sweats or weight changes.  Cardiovascular: negative for chest pain, dyspnea on exertion, edema, orthopnea, palpitations, paroxysmal nocturnal dyspnea or shortness  of breath Dermatological: negative for rash Respiratory: negative for cough or wheezing Urologic: negative for hematuria Abdominal: negative for nausea, vomiting, diarrhea, bright red blood per rectum, melena, or hematemesis Neurologic: negative for visual changes, syncope, or dizziness All other systems reviewed and are otherwise negative except as noted above.    Blood pressure (!) 154/90, pulse 66, height 5\' 11"  (1.803 m), weight 240 lb (108.9 kg).  General appearance: alert and no distress Neck: no adenopathy, no carotid bruit, no JVD, supple, symmetrical, trachea midline and thyroid not enlarged, symmetric, no tenderness/mass/nodules Lungs:  clear to auscultation bilaterally Heart: regular rate and rhythm, S1, S2 normal, no murmur, click, rub or gallop Extremities: extremities normal, atraumatic, no cyanosis or edema  EKG sinus rhythm at 66 without ST or T-wave changes. I personally reviewed this EKG  ASSESSMENT AND PLAN:   DVT (deep venous thrombosis) (HCC) History of right popliteal DVT back in 2013 on Xarelto for 18 months after that. He apparently had a venous Doppler done yesterday that showed a right distal superficial femoral vein thrombus. I'm going to rescan his right lower extremity today to be able to make a decision regarding initiation of oral anticoagulation.  Essential hypertension History of hypertension with blood pressure measured at 154/90. He is on lisinopril. Continue current meds at current dosing  Hyperlipidemia History of hyperlipidemia on atorvastatin by his PCP  Shortness of breath History of recent shortness of breath with orthopnea. He also saw his primary care physician who obtained a d-dimer that was minimally elevated which led to a venous duplex study revealed right superficial femoral vein DVT. I'm going to repeat his venous study today to assess whether or not he needs a prolonged oral anticoagulants and order a chest CT to rule out pulmonary embolus      Lorretta Harp MD [facility], [facility] 01/06/2017 10:05 AM

## 2017-01-06 NOTE — Assessment & Plan Note (Signed)
History of hyperlipidemia on atorvastatin by his PCP

## 2017-01-07 ENCOUNTER — Telehealth: Payer: Self-pay | Admitting: Cardiovascular Disease

## 2017-01-07 NOTE — Telephone Encounter (Signed)
error 

## 2017-01-08 DIAGNOSIS — M542 Cervicalgia: Secondary | ICD-10-CM | POA: Diagnosis not present

## 2017-01-11 DIAGNOSIS — M19072 Primary osteoarthritis, left ankle and foot: Secondary | ICD-10-CM | POA: Diagnosis not present

## 2017-01-11 DIAGNOSIS — M25572 Pain in left ankle and joints of left foot: Secondary | ICD-10-CM | POA: Diagnosis not present

## 2017-01-12 DIAGNOSIS — M62838 Other muscle spasm: Secondary | ICD-10-CM | POA: Diagnosis not present

## 2017-01-12 DIAGNOSIS — R109 Unspecified abdominal pain: Secondary | ICD-10-CM | POA: Diagnosis not present

## 2017-01-12 DIAGNOSIS — M6281 Muscle weakness (generalized): Secondary | ICD-10-CM | POA: Diagnosis not present

## 2017-01-12 DIAGNOSIS — R300 Dysuria: Secondary | ICD-10-CM | POA: Diagnosis not present

## 2017-01-14 ENCOUNTER — Other Ambulatory Visit: Payer: Self-pay

## 2017-01-14 DIAGNOSIS — D225 Melanocytic nevi of trunk: Secondary | ICD-10-CM | POA: Diagnosis not present

## 2017-01-14 DIAGNOSIS — L821 Other seborrheic keratosis: Secondary | ICD-10-CM | POA: Diagnosis not present

## 2017-01-14 DIAGNOSIS — D492 Neoplasm of unspecified behavior of bone, soft tissue, and skin: Secondary | ICD-10-CM | POA: Diagnosis not present

## 2017-01-14 DIAGNOSIS — D229 Melanocytic nevi, unspecified: Secondary | ICD-10-CM

## 2017-01-14 DIAGNOSIS — D485 Neoplasm of uncertain behavior of skin: Secondary | ICD-10-CM | POA: Diagnosis not present

## 2017-01-14 HISTORY — DX: Melanocytic nevi, unspecified: D22.9

## 2017-01-18 ENCOUNTER — Ambulatory Visit (HOSPITAL_COMMUNITY): Payer: BLUE CROSS/BLUE SHIELD | Attending: Cardiology

## 2017-01-18 ENCOUNTER — Other Ambulatory Visit: Payer: Self-pay

## 2017-01-18 DIAGNOSIS — R0600 Dyspnea, unspecified: Secondary | ICD-10-CM | POA: Insufficient documentation

## 2017-01-18 DIAGNOSIS — Z23 Encounter for immunization: Secondary | ICD-10-CM | POA: Diagnosis not present

## 2017-01-18 MED ORDER — PERFLUTREN LIPID MICROSPHERE
1.0000 mL | INTRAVENOUS | Status: AC | PRN
  Administered 2017-01-18: 1.5 mL via INTRAVENOUS

## 2017-01-19 DIAGNOSIS — E119 Type 2 diabetes mellitus without complications: Secondary | ICD-10-CM | POA: Diagnosis not present

## 2017-01-19 DIAGNOSIS — I10 Essential (primary) hypertension: Secondary | ICD-10-CM | POA: Diagnosis not present

## 2017-01-21 DIAGNOSIS — S63592D Other specified sprain of left wrist, subsequent encounter: Secondary | ICD-10-CM | POA: Diagnosis not present

## 2017-01-21 NOTE — Telephone Encounter (Signed)
New Message     Pt will call back at 4pm to get his test results , he can not receive incoming calls

## 2017-01-21 NOTE — Telephone Encounter (Signed)
Notes recorded by Lorretta Harp, MD on 01/18/2017 at 5:08 PM EDT Essentially normal study. Repeat when clinically indicated.  Pt notified he states that he is confused and disgruntled that his study was normal and he has questions about D-Dimer appt made for 01-28-2017

## 2017-01-21 NOTE — Telephone Encounter (Signed)
°  Follow Up   Calling back to follow up on echocardiogram results. Requesting a call back after 2:30 PM.

## 2017-01-27 DIAGNOSIS — M542 Cervicalgia: Secondary | ICD-10-CM | POA: Diagnosis not present

## 2017-01-27 DIAGNOSIS — R202 Paresthesia of skin: Secondary | ICD-10-CM | POA: Diagnosis not present

## 2017-01-28 ENCOUNTER — Ambulatory Visit (INDEPENDENT_AMBULATORY_CARE_PROVIDER_SITE_OTHER): Payer: BLUE CROSS/BLUE SHIELD | Admitting: Cardiovascular Disease

## 2017-01-28 ENCOUNTER — Encounter: Payer: Self-pay | Admitting: Cardiovascular Disease

## 2017-01-28 DIAGNOSIS — I82403 Acute embolism and thrombosis of unspecified deep veins of lower extremity, bilateral: Secondary | ICD-10-CM | POA: Diagnosis not present

## 2017-01-28 NOTE — Progress Notes (Signed)
Gordon Allen Returns today for follow-up of his recent tests. A CT angiogram done to rule out pulmonary embolus was normal. Lower extremity venous Dopplers revealed chronic DVT in his right distal superficial femoral vein and popliteal vein and no evidence of DVT on the left side. He has no swelling. I do not think he needs chronic oral anticoagulation. His 2-D echo was normal as well. He will see mid-level back in 6 months and me back in one year.

## 2017-01-28 NOTE — Patient Instructions (Signed)

## 2017-01-28 NOTE — Assessment & Plan Note (Signed)
Gordon Allen Returns today for follow-up of his recent tests. A CT angiogram done to rule out pulmonary embolus was normal. Lower extremity venous Dopplers revealed chronic DVT in his right distal superficial femoral vein and popliteal vein and no evidence of DVT on the left side. He has no swelling. I do not think he needs chronic oral anticoagulation. His 2-D echo was normal as well. He will see mid-level back in 6 months and me back in one year.

## 2017-01-30 DIAGNOSIS — S6982XA Other specified injuries of left wrist, hand and finger(s), initial encounter: Secondary | ICD-10-CM | POA: Diagnosis not present

## 2017-02-02 DIAGNOSIS — S63592D Other specified sprain of left wrist, subsequent encounter: Secondary | ICD-10-CM | POA: Diagnosis not present

## 2017-02-15 DIAGNOSIS — M25561 Pain in right knee: Secondary | ICD-10-CM | POA: Diagnosis not present

## 2017-02-15 DIAGNOSIS — S63592D Other specified sprain of left wrist, subsequent encounter: Secondary | ICD-10-CM | POA: Diagnosis not present

## 2017-02-15 DIAGNOSIS — S83241A Other tear of medial meniscus, current injury, right knee, initial encounter: Secondary | ICD-10-CM | POA: Diagnosis not present

## 2017-02-19 DIAGNOSIS — R202 Paresthesia of skin: Secondary | ICD-10-CM | POA: Diagnosis not present

## 2017-02-19 DIAGNOSIS — M542 Cervicalgia: Secondary | ICD-10-CM | POA: Diagnosis not present

## 2017-03-01 DIAGNOSIS — M25561 Pain in right knee: Secondary | ICD-10-CM | POA: Diagnosis not present

## 2017-03-08 DIAGNOSIS — M160 Bilateral primary osteoarthritis of hip: Secondary | ICD-10-CM | POA: Diagnosis not present

## 2017-03-09 DIAGNOSIS — M5412 Radiculopathy, cervical region: Secondary | ICD-10-CM | POA: Diagnosis not present

## 2017-03-09 DIAGNOSIS — M542 Cervicalgia: Secondary | ICD-10-CM | POA: Diagnosis not present

## 2017-03-09 DIAGNOSIS — R202 Paresthesia of skin: Secondary | ICD-10-CM | POA: Diagnosis not present

## 2017-03-15 DIAGNOSIS — M5412 Radiculopathy, cervical region: Secondary | ICD-10-CM | POA: Diagnosis not present

## 2017-03-16 DIAGNOSIS — M542 Cervicalgia: Secondary | ICD-10-CM | POA: Diagnosis not present

## 2017-03-22 DIAGNOSIS — M542 Cervicalgia: Secondary | ICD-10-CM | POA: Diagnosis not present

## 2017-03-22 DIAGNOSIS — M5412 Radiculopathy, cervical region: Secondary | ICD-10-CM | POA: Diagnosis not present

## 2017-04-06 DIAGNOSIS — M542 Cervicalgia: Secondary | ICD-10-CM | POA: Diagnosis not present

## 2017-04-06 DIAGNOSIS — M25561 Pain in right knee: Secondary | ICD-10-CM | POA: Diagnosis not present

## 2017-04-06 DIAGNOSIS — M4727 Other spondylosis with radiculopathy, lumbosacral region: Secondary | ICD-10-CM | POA: Diagnosis not present

## 2017-04-06 DIAGNOSIS — I10 Essential (primary) hypertension: Secondary | ICD-10-CM | POA: Diagnosis not present

## 2017-04-06 DIAGNOSIS — M160 Bilateral primary osteoarthritis of hip: Secondary | ICD-10-CM | POA: Diagnosis not present

## 2017-04-10 DIAGNOSIS — M25561 Pain in right knee: Secondary | ICD-10-CM | POA: Diagnosis not present

## 2017-04-13 DIAGNOSIS — S83241A Other tear of medial meniscus, current injury, right knee, initial encounter: Secondary | ICD-10-CM | POA: Diagnosis not present

## 2017-04-13 DIAGNOSIS — M25561 Pain in right knee: Secondary | ICD-10-CM | POA: Diagnosis not present

## 2017-04-19 DIAGNOSIS — M542 Cervicalgia: Secondary | ICD-10-CM | POA: Diagnosis not present

## 2017-04-19 DIAGNOSIS — M5412 Radiculopathy, cervical region: Secondary | ICD-10-CM | POA: Diagnosis not present

## 2017-05-13 IMAGING — CR DG CHEST 2V
2 series · 2 of 2 positions shown · non-contrast
Comparison: None.

CLINICAL DATA: One week of cough wheezing and shortness of breath
with chest congestion, nonsmoker.

EXAM:
CHEST  2 VIEW

[w chest pa]
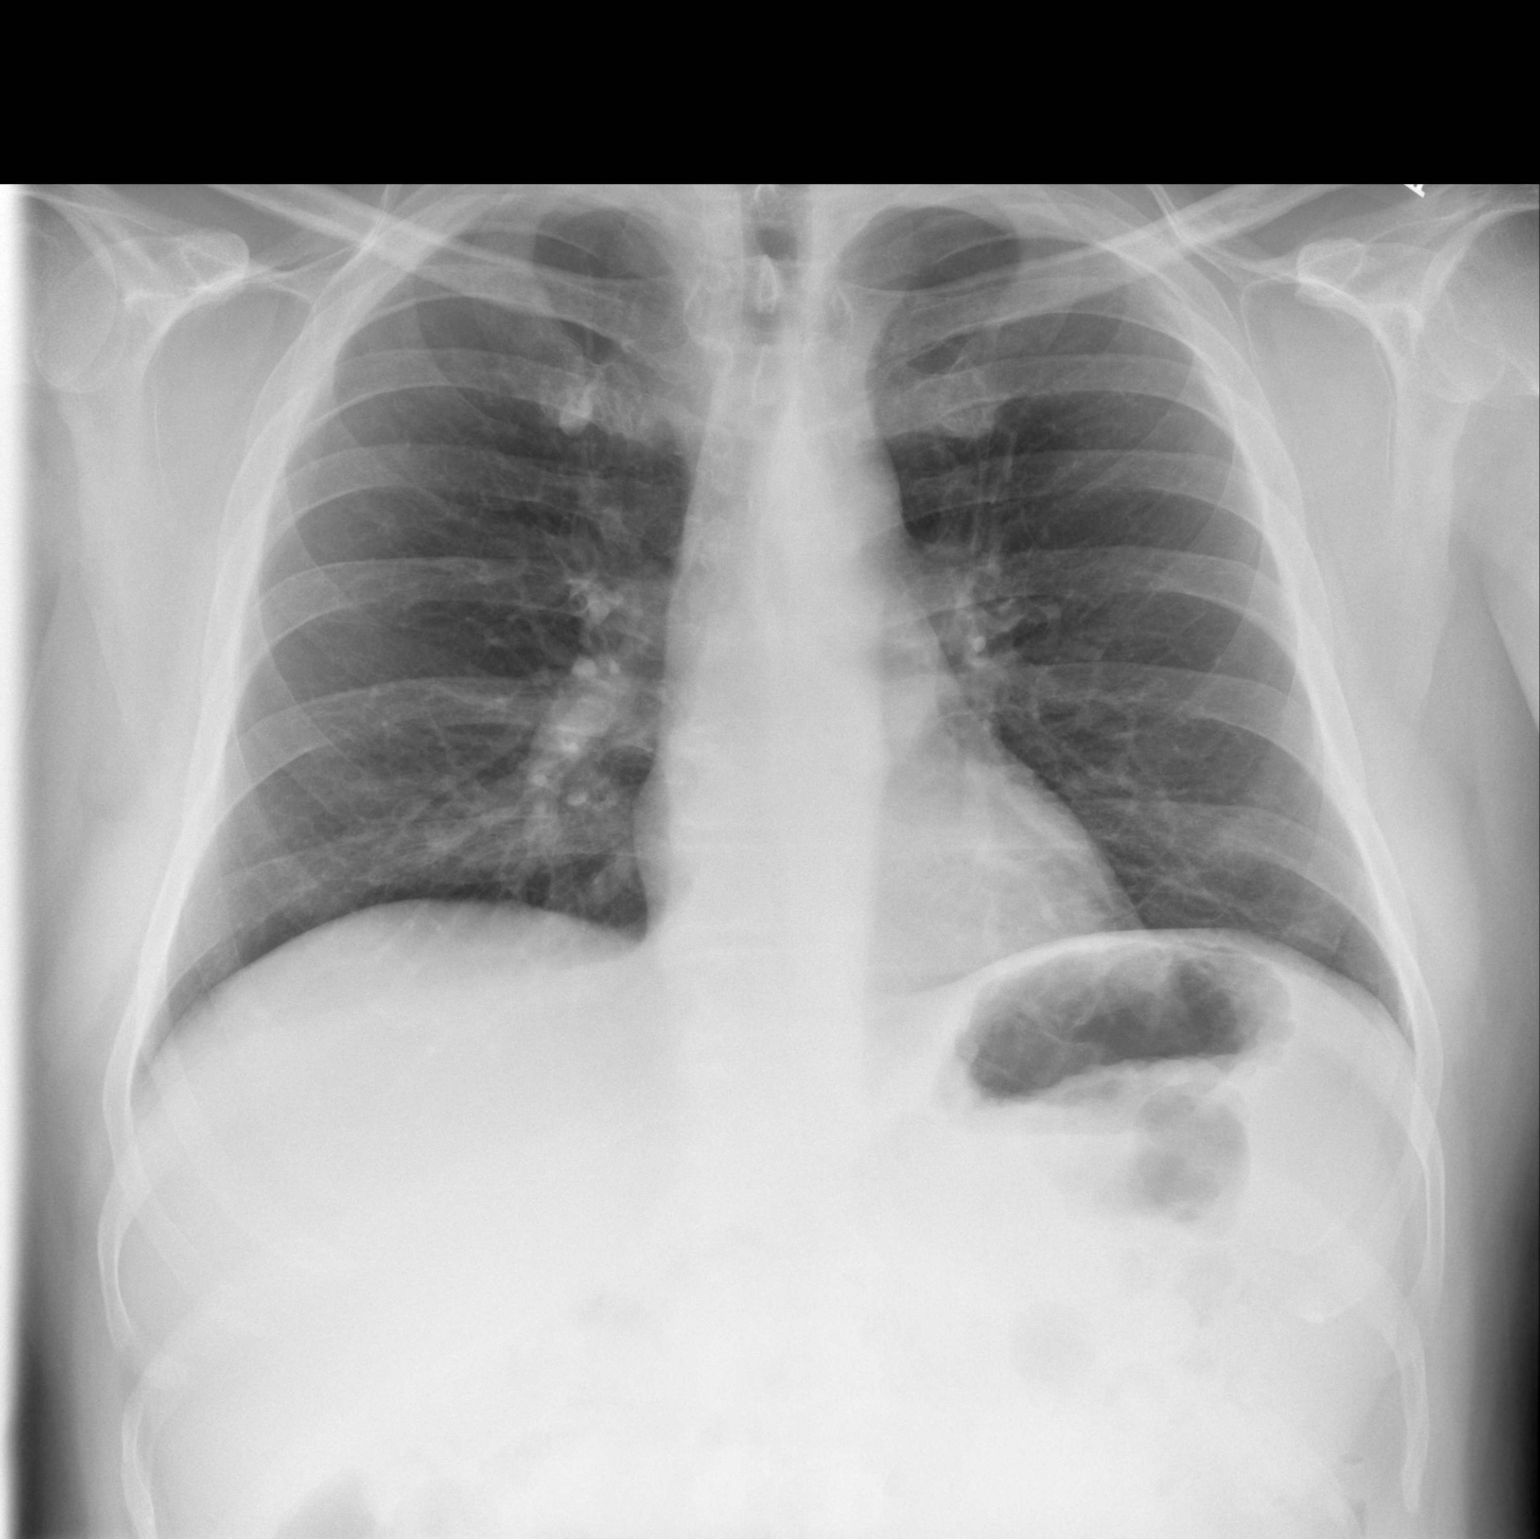

[w chest lat]
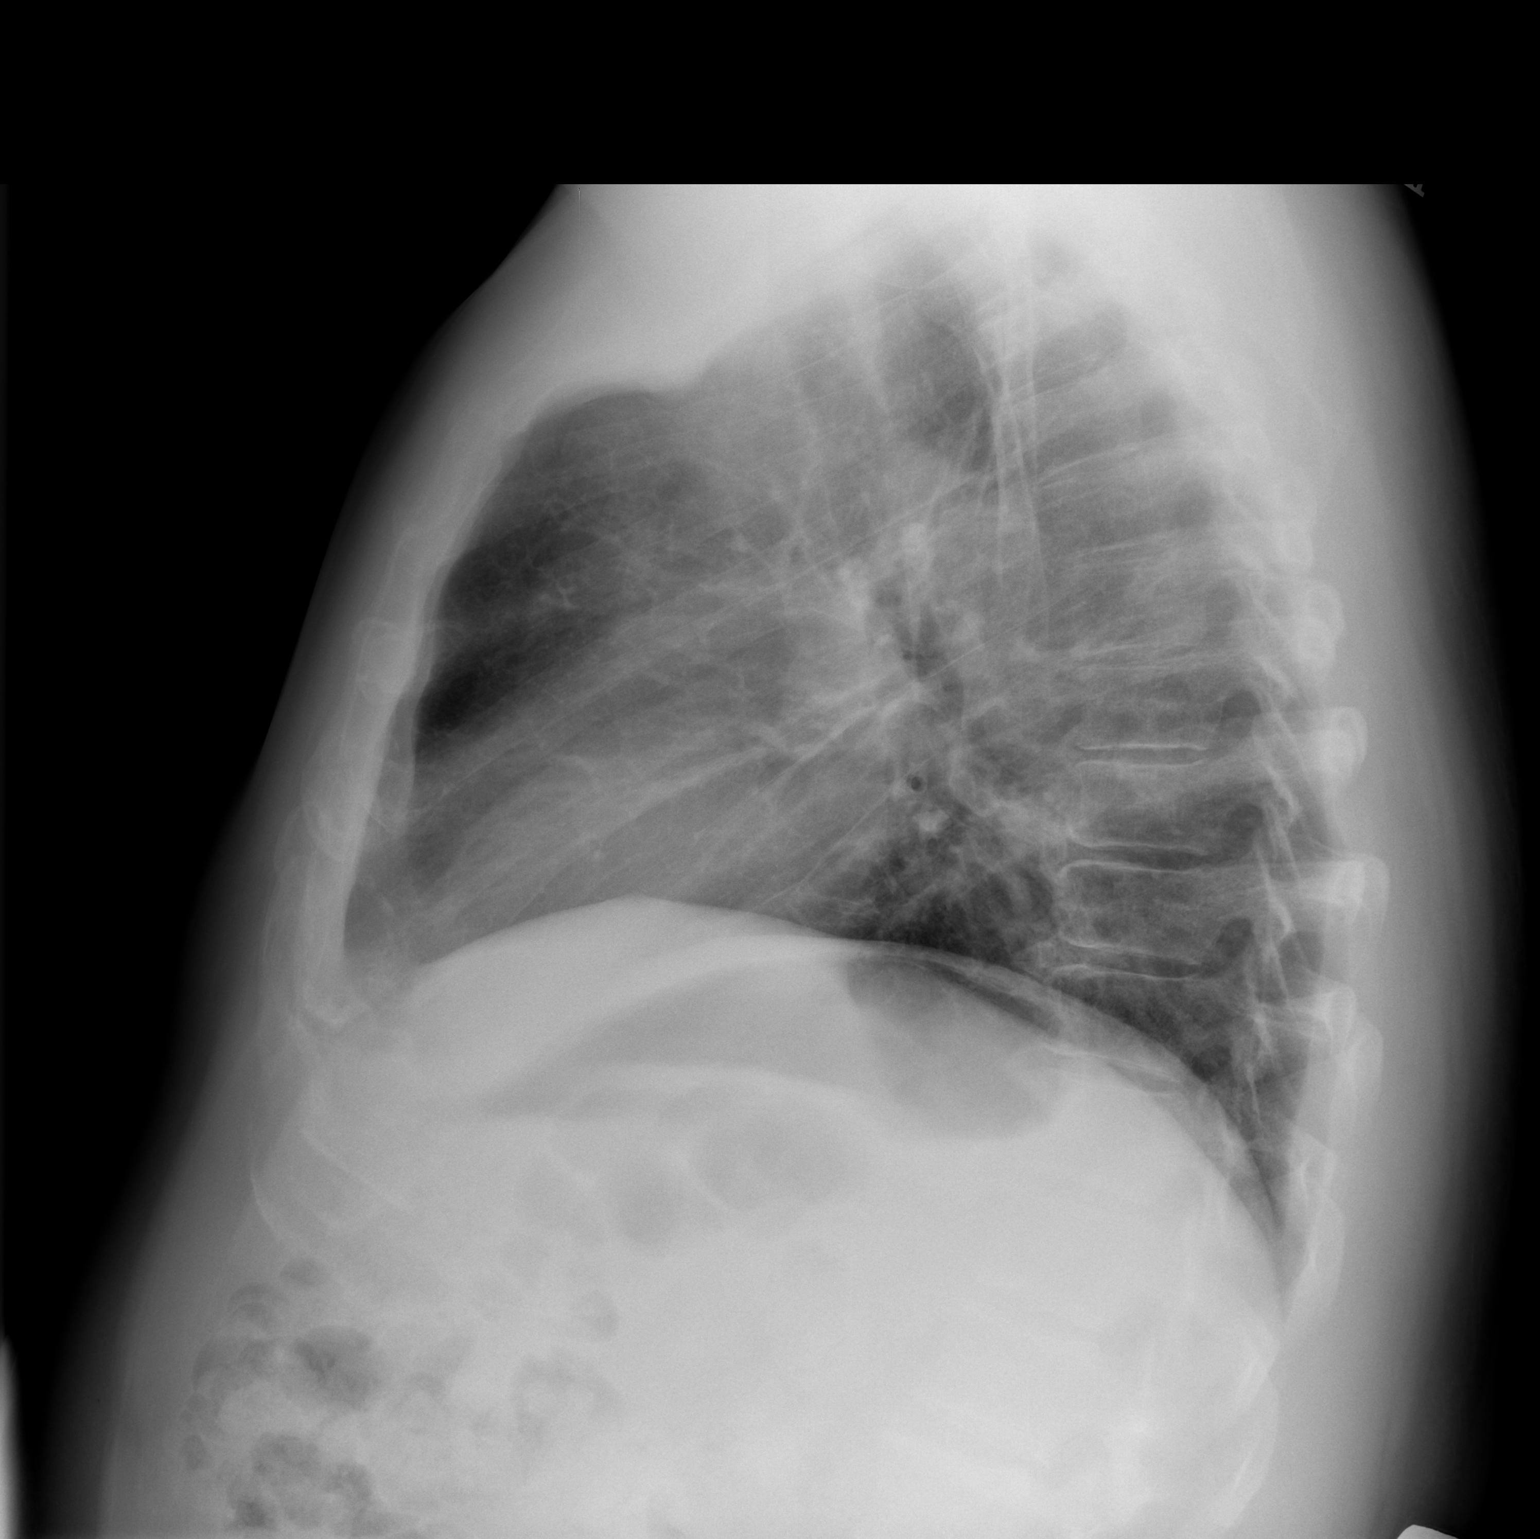

[2 of 2 positions shown; findings below may reference images not displayed]

FINDINGS: The lungs are adequately inflated. There is increased density in the
right lower lobe posteriorly. The heart and mediastinal structures
are normal. The trachea is midline. There is no pleural effusion.
The bony thorax is unremarkable.
IMPRESSION: Right lower lobe atelectasis or early pneumonia. Followup PA and
lateral chest X-ray is recommended in 3-4 weeks following trial of
antibiotic therapy to ensure resolution and exclude underlying
malignancy.

## 2017-05-17 DIAGNOSIS — M5412 Radiculopathy, cervical region: Secondary | ICD-10-CM | POA: Diagnosis not present

## 2017-05-17 DIAGNOSIS — G5601 Carpal tunnel syndrome, right upper limb: Secondary | ICD-10-CM | POA: Diagnosis not present

## 2017-05-17 DIAGNOSIS — M4727 Other spondylosis with radiculopathy, lumbosacral region: Secondary | ICD-10-CM | POA: Diagnosis not present

## 2017-05-17 DIAGNOSIS — M542 Cervicalgia: Secondary | ICD-10-CM | POA: Diagnosis not present

## 2017-05-17 DIAGNOSIS — M160 Bilateral primary osteoarthritis of hip: Secondary | ICD-10-CM | POA: Diagnosis not present

## 2017-05-17 DIAGNOSIS — S63592D Other specified sprain of left wrist, subsequent encounter: Secondary | ICD-10-CM | POA: Diagnosis not present

## 2017-05-26 DIAGNOSIS — M25551 Pain in right hip: Secondary | ICD-10-CM | POA: Diagnosis not present

## 2017-05-28 DIAGNOSIS — M25551 Pain in right hip: Secondary | ICD-10-CM | POA: Diagnosis not present

## 2017-05-31 DIAGNOSIS — M25551 Pain in right hip: Secondary | ICD-10-CM | POA: Diagnosis not present

## 2017-06-02 DIAGNOSIS — M25551 Pain in right hip: Secondary | ICD-10-CM | POA: Diagnosis not present

## 2017-06-08 DIAGNOSIS — M25551 Pain in right hip: Secondary | ICD-10-CM | POA: Diagnosis not present

## 2017-06-08 DIAGNOSIS — J988 Other specified respiratory disorders: Secondary | ICD-10-CM | POA: Diagnosis not present

## 2017-06-08 DIAGNOSIS — Z23 Encounter for immunization: Secondary | ICD-10-CM | POA: Diagnosis not present

## 2017-06-14 DIAGNOSIS — M25551 Pain in right hip: Secondary | ICD-10-CM | POA: Diagnosis not present

## 2017-06-14 DIAGNOSIS — G5602 Carpal tunnel syndrome, left upper limb: Secondary | ICD-10-CM | POA: Diagnosis not present

## 2017-06-14 DIAGNOSIS — S63592D Other specified sprain of left wrist, subsequent encounter: Secondary | ICD-10-CM | POA: Diagnosis not present

## 2017-06-17 DIAGNOSIS — M25551 Pain in right hip: Secondary | ICD-10-CM | POA: Diagnosis not present

## 2017-06-22 DIAGNOSIS — M25551 Pain in right hip: Secondary | ICD-10-CM | POA: Diagnosis not present

## 2017-06-24 DIAGNOSIS — M25551 Pain in right hip: Secondary | ICD-10-CM | POA: Diagnosis not present

## 2017-06-30 DIAGNOSIS — M25551 Pain in right hip: Secondary | ICD-10-CM | POA: Diagnosis not present

## 2017-07-05 ENCOUNTER — Other Ambulatory Visit: Payer: Self-pay

## 2017-07-05 DIAGNOSIS — D492 Neoplasm of unspecified behavior of bone, soft tissue, and skin: Secondary | ICD-10-CM | POA: Diagnosis not present

## 2017-07-05 DIAGNOSIS — L821 Other seborrheic keratosis: Secondary | ICD-10-CM | POA: Diagnosis not present

## 2017-07-05 DIAGNOSIS — D229 Melanocytic nevi, unspecified: Secondary | ICD-10-CM | POA: Diagnosis not present

## 2017-07-05 DIAGNOSIS — L570 Actinic keratosis: Secondary | ICD-10-CM | POA: Diagnosis not present

## 2017-07-06 DIAGNOSIS — M25551 Pain in right hip: Secondary | ICD-10-CM | POA: Diagnosis not present

## 2017-07-08 DIAGNOSIS — M25551 Pain in right hip: Secondary | ICD-10-CM | POA: Diagnosis not present

## 2017-07-26 DIAGNOSIS — G5602 Carpal tunnel syndrome, left upper limb: Secondary | ICD-10-CM | POA: Diagnosis not present

## 2017-07-26 DIAGNOSIS — M47816 Spondylosis without myelopathy or radiculopathy, lumbar region: Secondary | ICD-10-CM | POA: Diagnosis not present

## 2017-07-26 DIAGNOSIS — S63592D Other specified sprain of left wrist, subsequent encounter: Secondary | ICD-10-CM | POA: Diagnosis not present

## 2017-08-05 DIAGNOSIS — J321 Chronic frontal sinusitis: Secondary | ICD-10-CM | POA: Diagnosis not present

## 2017-08-05 DIAGNOSIS — J3489 Other specified disorders of nose and nasal sinuses: Secondary | ICD-10-CM | POA: Diagnosis not present

## 2017-08-05 DIAGNOSIS — J019 Acute sinusitis, unspecified: Secondary | ICD-10-CM | POA: Diagnosis not present

## 2017-08-05 DIAGNOSIS — J301 Allergic rhinitis due to pollen: Secondary | ICD-10-CM | POA: Diagnosis not present

## 2017-08-13 DIAGNOSIS — L570 Actinic keratosis: Secondary | ICD-10-CM | POA: Diagnosis not present

## 2017-08-16 DIAGNOSIS — J301 Allergic rhinitis due to pollen: Secondary | ICD-10-CM | POA: Diagnosis not present

## 2017-08-18 DIAGNOSIS — G5602 Carpal tunnel syndrome, left upper limb: Secondary | ICD-10-CM | POA: Diagnosis not present

## 2017-08-18 DIAGNOSIS — M24832 Other specific joint derangements of left wrist, not elsewhere classified: Secondary | ICD-10-CM | POA: Diagnosis not present

## 2017-08-18 DIAGNOSIS — M948X3 Other specified disorders of cartilage, forearm: Secondary | ICD-10-CM | POA: Diagnosis not present

## 2017-08-18 DIAGNOSIS — M24132 Other articular cartilage disorders, left wrist: Secondary | ICD-10-CM | POA: Diagnosis not present

## 2017-08-25 DIAGNOSIS — Z6833 Body mass index (BMI) 33.0-33.9, adult: Secondary | ICD-10-CM | POA: Diagnosis not present

## 2017-08-25 DIAGNOSIS — I10 Essential (primary) hypertension: Secondary | ICD-10-CM | POA: Diagnosis not present

## 2017-08-25 DIAGNOSIS — M160 Bilateral primary osteoarthritis of hip: Secondary | ICD-10-CM | POA: Diagnosis not present

## 2017-08-25 DIAGNOSIS — M47816 Spondylosis without myelopathy or radiculopathy, lumbar region: Secondary | ICD-10-CM | POA: Diagnosis not present

## 2017-12-07 DIAGNOSIS — M654 Radial styloid tenosynovitis [de Quervain]: Secondary | ICD-10-CM | POA: Diagnosis not present

## 2017-12-07 DIAGNOSIS — G5602 Carpal tunnel syndrome, left upper limb: Secondary | ICD-10-CM | POA: Diagnosis not present

## 2017-12-07 DIAGNOSIS — S63512D Sprain of carpal joint of left wrist, subsequent encounter: Secondary | ICD-10-CM | POA: Diagnosis not present

## 2017-12-08 DIAGNOSIS — R1084 Generalized abdominal pain: Secondary | ICD-10-CM | POA: Diagnosis not present

## 2017-12-08 DIAGNOSIS — R1111 Vomiting without nausea: Secondary | ICD-10-CM | POA: Diagnosis not present

## 2017-12-13 DIAGNOSIS — M533 Sacrococcygeal disorders, not elsewhere classified: Secondary | ICD-10-CM | POA: Diagnosis not present

## 2018-01-03 DIAGNOSIS — L570 Actinic keratosis: Secondary | ICD-10-CM | POA: Diagnosis not present

## 2018-01-03 DIAGNOSIS — J3489 Other specified disorders of nose and nasal sinuses: Secondary | ICD-10-CM | POA: Diagnosis not present

## 2018-01-03 DIAGNOSIS — L820 Inflamed seborrheic keratosis: Secondary | ICD-10-CM | POA: Diagnosis not present

## 2018-01-03 DIAGNOSIS — L259 Unspecified contact dermatitis, unspecified cause: Secondary | ICD-10-CM | POA: Diagnosis not present

## 2018-01-07 DIAGNOSIS — J370 Chronic laryngitis: Secondary | ICD-10-CM | POA: Diagnosis not present

## 2018-01-07 DIAGNOSIS — J320 Chronic maxillary sinusitis: Secondary | ICD-10-CM | POA: Diagnosis not present

## 2018-01-07 DIAGNOSIS — J322 Chronic ethmoidal sinusitis: Secondary | ICD-10-CM | POA: Diagnosis not present

## 2018-01-12 DIAGNOSIS — R05 Cough: Secondary | ICD-10-CM | POA: Diagnosis not present

## 2018-01-12 DIAGNOSIS — R490 Dysphonia: Secondary | ICD-10-CM | POA: Diagnosis not present

## 2018-01-12 DIAGNOSIS — J301 Allergic rhinitis due to pollen: Secondary | ICD-10-CM | POA: Diagnosis not present

## 2018-01-12 DIAGNOSIS — J040 Acute laryngitis: Secondary | ICD-10-CM | POA: Diagnosis not present

## 2018-01-14 DIAGNOSIS — L309 Dermatitis, unspecified: Secondary | ICD-10-CM | POA: Diagnosis not present

## 2018-01-26 DIAGNOSIS — N5201 Erectile dysfunction due to arterial insufficiency: Secondary | ICD-10-CM | POA: Diagnosis not present

## 2018-01-26 DIAGNOSIS — N200 Calculus of kidney: Secondary | ICD-10-CM | POA: Diagnosis not present

## 2018-01-26 DIAGNOSIS — N401 Enlarged prostate with lower urinary tract symptoms: Secondary | ICD-10-CM | POA: Diagnosis not present

## 2018-01-26 DIAGNOSIS — R350 Frequency of micturition: Secondary | ICD-10-CM | POA: Diagnosis not present

## 2018-01-27 DIAGNOSIS — M47816 Spondylosis without myelopathy or radiculopathy, lumbar region: Secondary | ICD-10-CM | POA: Diagnosis not present

## 2018-01-27 DIAGNOSIS — M533 Sacrococcygeal disorders, not elsewhere classified: Secondary | ICD-10-CM | POA: Diagnosis not present

## 2018-01-27 DIAGNOSIS — M160 Bilateral primary osteoarthritis of hip: Secondary | ICD-10-CM | POA: Diagnosis not present

## 2018-02-03 DIAGNOSIS — D229 Melanocytic nevi, unspecified: Secondary | ICD-10-CM | POA: Diagnosis not present

## 2018-02-03 DIAGNOSIS — L309 Dermatitis, unspecified: Secondary | ICD-10-CM | POA: Diagnosis not present

## 2018-02-10 DIAGNOSIS — I10 Essential (primary) hypertension: Secondary | ICD-10-CM | POA: Diagnosis not present

## 2018-02-10 DIAGNOSIS — Z1211 Encounter for screening for malignant neoplasm of colon: Secondary | ICD-10-CM | POA: Diagnosis not present

## 2018-02-10 DIAGNOSIS — Z0000 Encounter for general adult medical examination without abnormal findings: Secondary | ICD-10-CM | POA: Diagnosis not present

## 2018-02-10 DIAGNOSIS — Z125 Encounter for screening for malignant neoplasm of prostate: Secondary | ICD-10-CM | POA: Diagnosis not present

## 2018-02-23 DIAGNOSIS — J039 Acute tonsillitis, unspecified: Secondary | ICD-10-CM | POA: Diagnosis not present

## 2018-02-23 DIAGNOSIS — R490 Dysphonia: Secondary | ICD-10-CM | POA: Diagnosis not present

## 2018-02-23 DIAGNOSIS — K210 Gastro-esophageal reflux disease with esophagitis: Secondary | ICD-10-CM | POA: Diagnosis not present

## 2018-02-23 DIAGNOSIS — J370 Chronic laryngitis: Secondary | ICD-10-CM | POA: Diagnosis not present

## 2018-03-08 DIAGNOSIS — G5602 Carpal tunnel syndrome, left upper limb: Secondary | ICD-10-CM | POA: Diagnosis not present

## 2018-03-08 DIAGNOSIS — M67331 Transient synovitis, right wrist: Secondary | ICD-10-CM | POA: Diagnosis not present

## 2018-03-08 DIAGNOSIS — M654 Radial styloid tenosynovitis [de Quervain]: Secondary | ICD-10-CM | POA: Diagnosis not present

## 2018-03-08 DIAGNOSIS — S63512D Sprain of carpal joint of left wrist, subsequent encounter: Secondary | ICD-10-CM | POA: Diagnosis not present

## 2018-03-10 DIAGNOSIS — E119 Type 2 diabetes mellitus without complications: Secondary | ICD-10-CM | POA: Diagnosis not present

## 2018-03-10 DIAGNOSIS — E785 Hyperlipidemia, unspecified: Secondary | ICD-10-CM | POA: Diagnosis not present

## 2018-03-10 DIAGNOSIS — K760 Fatty (change of) liver, not elsewhere classified: Secondary | ICD-10-CM | POA: Diagnosis not present

## 2018-03-10 DIAGNOSIS — I10 Essential (primary) hypertension: Secondary | ICD-10-CM | POA: Diagnosis not present

## 2018-03-16 DIAGNOSIS — E119 Type 2 diabetes mellitus without complications: Secondary | ICD-10-CM | POA: Diagnosis not present

## 2018-03-16 DIAGNOSIS — I10 Essential (primary) hypertension: Secondary | ICD-10-CM | POA: Diagnosis not present

## 2018-03-16 DIAGNOSIS — E785 Hyperlipidemia, unspecified: Secondary | ICD-10-CM | POA: Diagnosis not present

## 2018-03-16 DIAGNOSIS — K219 Gastro-esophageal reflux disease without esophagitis: Secondary | ICD-10-CM | POA: Diagnosis not present

## 2018-03-17 DIAGNOSIS — K210 Gastro-esophageal reflux disease with esophagitis: Secondary | ICD-10-CM | POA: Diagnosis not present

## 2018-03-17 DIAGNOSIS — J301 Allergic rhinitis due to pollen: Secondary | ICD-10-CM | POA: Diagnosis not present

## 2018-04-11 DIAGNOSIS — E119 Type 2 diabetes mellitus without complications: Secondary | ICD-10-CM | POA: Diagnosis not present

## 2018-04-12 DIAGNOSIS — M533 Sacrococcygeal disorders, not elsewhere classified: Secondary | ICD-10-CM | POA: Diagnosis not present

## 2018-04-12 DIAGNOSIS — M47816 Spondylosis without myelopathy or radiculopathy, lumbar region: Secondary | ICD-10-CM | POA: Diagnosis not present

## 2018-04-12 DIAGNOSIS — M5412 Radiculopathy, cervical region: Secondary | ICD-10-CM | POA: Diagnosis not present

## 2018-04-12 DIAGNOSIS — M160 Bilateral primary osteoarthritis of hip: Secondary | ICD-10-CM | POA: Diagnosis not present

## 2018-05-19 DIAGNOSIS — I10 Essential (primary) hypertension: Secondary | ICD-10-CM | POA: Diagnosis not present

## 2018-05-19 DIAGNOSIS — E785 Hyperlipidemia, unspecified: Secondary | ICD-10-CM | POA: Diagnosis not present

## 2018-05-19 DIAGNOSIS — E119 Type 2 diabetes mellitus without complications: Secondary | ICD-10-CM | POA: Diagnosis not present

## 2018-05-19 DIAGNOSIS — Z23 Encounter for immunization: Secondary | ICD-10-CM | POA: Diagnosis not present

## 2018-06-10 DIAGNOSIS — T1511XD Foreign body in conjunctival sac, right eye, subsequent encounter: Secondary | ICD-10-CM | POA: Diagnosis not present

## 2018-06-10 DIAGNOSIS — E119 Type 2 diabetes mellitus without complications: Secondary | ICD-10-CM | POA: Diagnosis not present

## 2018-06-10 DIAGNOSIS — L570 Actinic keratosis: Secondary | ICD-10-CM | POA: Diagnosis not present

## 2018-06-10 DIAGNOSIS — L820 Inflamed seborrheic keratosis: Secondary | ICD-10-CM | POA: Diagnosis not present

## 2018-06-10 DIAGNOSIS — L219 Seborrheic dermatitis, unspecified: Secondary | ICD-10-CM | POA: Diagnosis not present

## 2018-06-13 DIAGNOSIS — M25551 Pain in right hip: Secondary | ICD-10-CM | POA: Diagnosis not present

## 2018-06-20 DIAGNOSIS — M25551 Pain in right hip: Secondary | ICD-10-CM | POA: Diagnosis not present

## 2018-06-30 ENCOUNTER — Encounter: Payer: Self-pay | Admitting: Gastroenterology

## 2018-06-30 ENCOUNTER — Ambulatory Visit: Payer: BLUE CROSS/BLUE SHIELD | Admitting: Gastroenterology

## 2018-06-30 VITALS — BP 108/78 | HR 68 | Ht 71.0 in | Wt 220.0 lb

## 2018-06-30 DIAGNOSIS — R12 Heartburn: Secondary | ICD-10-CM | POA: Diagnosis not present

## 2018-06-30 DIAGNOSIS — M25551 Pain in right hip: Secondary | ICD-10-CM | POA: Diagnosis not present

## 2018-06-30 DIAGNOSIS — R070 Pain in throat: Secondary | ICD-10-CM | POA: Diagnosis not present

## 2018-06-30 NOTE — Patient Instructions (Addendum)
If you are age 51 or older, your body mass index should be between 23-30. Your Body mass index is 30.68 kg/m. If this is out of the aforementioned range listed, please consider follow up with your Primary Care Provider.  If you are age 81 or younger, your body mass index should be between 19-25. Your Body mass index is 30.68 kg/m. If this is out of the aformentioned range listed, please consider follow up with your Primary Care Provider.   If you decide to STOP the omeprazole, change to Pepcid (famotidine) 40mg  twice a day.  It was a pleasure to see you today!  Dr. Loletha Carrow  Gastroesophageal Reflux Disease, Adult Normally, food travels down the esophagus and stays in the stomach to be digested. If a person has gastroesophageal reflux disease (GERD), food and stomach acid move back up into the esophagus. When this happens, the esophagus becomes sore and swollen (inflamed). Over time, GERD can make small holes (ulcers) in the lining of the esophagus. Follow these instructions at home: Diet  Follow a diet as told by your doctor. You may need to avoid foods and drinks such as: ? Coffee and tea (with or without caffeine). ? Drinks that contain alcohol. ? Energy drinks and sports drinks. ? Carbonated drinks or sodas. ? Chocolate and cocoa. ? Peppermint and mint flavorings. ? Garlic and onions. ? Horseradish. ? Spicy and acidic foods, such as peppers, chili powder, curry powder, vinegar, hot sauces, and BBQ sauce. ? Citrus fruit juices and citrus fruits, such as oranges, lemons, and limes. ? Tomato-based foods, such as red sauce, chili, salsa, and pizza with red sauce. ? Fried and fatty foods, such as donuts, french fries, potato chips, and high-fat dressings. ? High-fat meats, such as hot dogs, rib eye steak, sausage, ham, and bacon. ? High-fat dairy items, such as whole milk, butter, and cream cheese.  Eat small meals often. Avoid eating large meals.  Avoid drinking large amounts of liquid  with your meals.  Avoid eating meals during the 2-3 hours before bedtime.  Avoid lying down right after you eat.  Do not exercise right after you eat. General instructions  Pay attention to any changes in your symptoms.  Take over-the-counter and prescription medicines only as told by your doctor. Do not take aspirin, ibuprofen, or other NSAIDs unless your doctor says it is okay.  Do not use any tobacco products, including cigarettes, chewing tobacco, and e-cigarettes. If you need help quitting, ask your doctor.  Wear loose clothes. Do not wear anything tight around your waist.  Raise (elevate) the head of your bed about 6 inches (15 cm).  Try to lower your stress. If you need help doing this, ask your doctor.  If you are overweight, lose an amount of weight that is healthy for you. Ask your doctor about a safe weight loss goal.  Keep all follow-up visits as told by your doctor. This is important. Contact a doctor if:  You have new symptoms.  You lose weight and you do not know why it is happening.  You have trouble swallowing, or it hurts to swallow.  You have wheezing or a cough that keeps happening.  Your symptoms do not get better with treatment.  You have a hoarse voice. Get help right away if:  You have pain in your arms, neck, jaw, teeth, or back.  You feel sweaty, dizzy, or light-headed.  You have chest pain or shortness of breath.  You throw up (vomit) and your  throw up looks like blood or coffee grounds.  You pass out (faint).  Your poop (stool) is bloody or black.  You cannot swallow, drink, or eat. This information is not intended to replace advice given to you by your health care provider. Make sure you discuss any questions you have with your health care provider. Document Released: 02/03/2008 Document Revised: 01/23/2016 Document Reviewed: 12/12/2014 Elsevier Interactive Patient Education  2018 [place] for Gastroesophageal  Reflux Disease, Adult When you have gastroesophageal reflux disease (GERD), the foods you eat and your eating habits are very important. Choosing the right foods can help ease your discomfort. What guidelines do I need to follow?  Choose fruits, vegetables, whole grains, and low-fat dairy products.  Choose low-fat meat, fish, and poultry.  Limit fats such as oils, salad dressings, butter, nuts, and avocado.  Keep a food diary. This helps you identify foods that cause symptoms.  Avoid foods that cause symptoms. These may be different for everyone.  Eat small meals often instead of 3 large meals a day.  Eat your meals slowly, in a place where you are relaxed.  Limit fried foods.  Cook foods using methods other than frying.  Avoid drinking alcohol.  Avoid drinking large amounts of liquids with your meals.  Avoid bending over or lying down until 2-3 hours after eating. What foods are not recommended? These are some foods and drinks that may make your symptoms worse: Vegetables Tomatoes. Tomato juice. Tomato and spaghetti sauce. Chili peppers. Onion and garlic. Horseradish. Fruits Oranges, grapefruit, and lemon (fruit and juice). Meats High-fat meats, fish, and poultry. This includes hot dogs, ribs, ham, sausage, salami, and bacon. Dairy Whole milk and chocolate milk. Sour cream. Cream. Butter. Ice cream. Cream cheese. Drinks Coffee and tea. Bubbly (carbonated) drinks or energy drinks. Condiments Hot sauce. Barbecue sauce. Sweets/Desserts Chocolate and cocoa. Donuts. Peppermint and spearmint. Fats and Oils High-fat foods. This includes [place] fries and potato chips. Other Vinegar. Strong spices. This includes black pepper, white pepper, red pepper, cayenne, curry powder, cloves, ginger, and chili powder. The items listed above may not be a complete list of foods and drinks to avoid. Contact your dietitian for more information. This information is not intended to replace  advice given to you by your health care provider. Make sure you discuss any questions you have with your health care provider. Document Released: 02/16/2012 Document Revised: 01/23/2016 Document Reviewed: 06/21/2013 Elsevier Interactive Patient Education  2017 [employer].

## 2018-06-30 NOTE — Progress Notes (Signed)
[facility] Progress Note  Chief Complaint: Gastroesophageal reflux  Subjective  History:  Gordon Allen was seen for heartburn. He was seen November 2017 for chronic abdominal pain, bloating, altered bowel habits, belching, heartburn and noncardiac chest pain.  EGD normal.  Colonoscopy to the terminal ileum normal exam with good preparation.  He remains on chronic opioid therapy.  Recent primary care note indicates persistent GERD symptoms as well as some throat discomfort for which he apparently saw a ENT Ernesto Rutherford) and had what sounds like a fiberoptic exam describing "tonsillitis/irritation".  He was then placed on doxycycline.  He gives a lengthy description of his heartburn with feelings of acid and burning in the throat as well as a throat discomfort and throat clearing with mucus production. He is concerned about the possible effect on calcium absorption and therefore bone loss from PPI therapy.  This is in the setting of a hip problem, and his orthopedic physician thinks he will need a hip replacement and had concerns about his PPI therapy.  Somebody had added nighttime Zantac to his omeprazole. He denies dysphagia or odynophagia.  Appetite is good and weight stable. ROS: Cardiovascular:  no chest pain Respiratory: no dyspnea Bilateral hip pain.  The patient's Past Medical, Family and Social History were reviewed and are on file in the EMR.  Objective:  Med list reviewed  Current Outpatient Medications:  .  aspirin EC 81 MG tablet, Take 81 mg by mouth daily., Disp: , Rfl:  .  atorvastatin (LIPITOR) 20 MG tablet, Take 20 mg by mouth daily., Disp: , Rfl:  .  JARDIANCE 25 MG TABS tablet, Take 1 tablet by mouth daily., Disp: , Rfl: 0 .  lisinopril (PRINIVIL,ZESTRIL) 20 MG tablet, Take 20 mg by mouth 2 (two) times daily., Disp: , Rfl:  .  metFORMIN (GLUCOPHAGE) 1000 MG tablet, Take 1,000 mg by mouth 2 (two) times daily with a meal., Disp: , Rfl:  .  omeprazole (PRILOSEC) 40  MG capsule, Take 1 capsule (40 mg total) by mouth daily., Disp: 30 capsule, Rfl: 3 .  oxymorphone (OPANA) 5 MG tablet, Take 5 mg by mouth every 6 (six) hours as needed for pain., Disp: , Rfl:  .  polyethylene glycol (MIRALAX / GLYCOLAX) packet, Take 17 g by mouth daily as needed (for constipation)., Disp: , Rfl:    Vital signs in last 24 hrs: Vitals:   06/30/18 0953  BP: 108/78  Pulse: 68    Physical Exam  He is well-appearing, pleasant and conversational, normal vocal quality.  HEENT: sclera anicteric, oral mucosa moist without lesions  Neck: supple, no thyromegaly, JVD or lymphadenopathy  Cardiac: RRR without murmurs, S1S2 heard, no peripheral edema  Pulm: clear to auscultation bilaterally, normal RR and effort noted  Abdomen: soft, no tenderness, with active bowel sounds. No guarding or palpable hepatosplenomegaly.  Skin; warm and dry, no jaundice or rash  Recent Labs:  Primary care labs a recent hemoglobin A1c of 10.1 Glucose 153, creatinine 0.8 calcium 9.2  @ASSESSMENTPLANBEGIN @ Assessment: Encounter Diagnoses  Name Primary?  . Heartburn Yes  . Throat pain    While I believe he has reflux that explains heartburn, I suspect upper respiratory/sinus, allergic postnasal drip issues have more to do with his throat pain than reflux does.  As for what to do with his acid suppression, I will need to leave that decision to him.  We discussed how studies have given conflicting results on the likelihood of PPI therapy causing calcium malabsorption and  bone loss.  If that is a real phenomenon, and if he does not want to therefore take that chance, then he should take famotidine 40 mg twice daily.  Ranitidine has been taken off the market due to some safety concerns.  We also discussed the need for all of the diet and lifestyle things that are at least, if not more, important then antacid therapy.  Most notably, he has started making efforts at better health with purposeful weight  loss.  He needs considerably better control of his diabetes to improve reflux symptoms as well.  GERD related literature was given.  I am happy to see him as needed.  Total time 30 minutes, over half spent face-to-face with patient in counseling and coordination of care.   Nelida Meuse III

## 2018-07-04 DIAGNOSIS — M160 Bilateral primary osteoarthritis of hip: Secondary | ICD-10-CM | POA: Diagnosis not present

## 2018-07-04 DIAGNOSIS — I10 Essential (primary) hypertension: Secondary | ICD-10-CM | POA: Diagnosis not present

## 2018-07-04 DIAGNOSIS — M5412 Radiculopathy, cervical region: Secondary | ICD-10-CM | POA: Diagnosis not present

## 2018-07-04 DIAGNOSIS — M533 Sacrococcygeal disorders, not elsewhere classified: Secondary | ICD-10-CM | POA: Diagnosis not present

## 2018-07-14 DIAGNOSIS — M25551 Pain in right hip: Secondary | ICD-10-CM | POA: Diagnosis not present

## 2018-07-18 DIAGNOSIS — L570 Actinic keratosis: Secondary | ICD-10-CM | POA: Diagnosis not present

## 2018-07-19 DIAGNOSIS — M654 Radial styloid tenosynovitis [de Quervain]: Secondary | ICD-10-CM | POA: Diagnosis not present

## 2018-07-19 DIAGNOSIS — G5602 Carpal tunnel syndrome, left upper limb: Secondary | ICD-10-CM | POA: Diagnosis not present

## 2018-08-02 DIAGNOSIS — M25532 Pain in left wrist: Secondary | ICD-10-CM | POA: Diagnosis not present

## 2018-08-04 DIAGNOSIS — G5602 Carpal tunnel syndrome, left upper limb: Secondary | ICD-10-CM | POA: Diagnosis not present

## 2018-08-04 DIAGNOSIS — M24131 Other articular cartilage disorders, right wrist: Secondary | ICD-10-CM | POA: Diagnosis not present

## 2018-08-04 DIAGNOSIS — M25551 Pain in right hip: Secondary | ICD-10-CM | POA: Diagnosis not present

## 2018-08-04 DIAGNOSIS — M654 Radial styloid tenosynovitis [de Quervain]: Secondary | ICD-10-CM | POA: Diagnosis not present

## 2018-08-04 DIAGNOSIS — M25531 Pain in right wrist: Secondary | ICD-10-CM | POA: Diagnosis not present

## 2018-08-08 DIAGNOSIS — M25551 Pain in right hip: Secondary | ICD-10-CM | POA: Diagnosis not present

## 2018-08-16 DIAGNOSIS — M47816 Spondylosis without myelopathy or radiculopathy, lumbar region: Secondary | ICD-10-CM | POA: Diagnosis not present

## 2018-08-18 DIAGNOSIS — E119 Type 2 diabetes mellitus without complications: Secondary | ICD-10-CM | POA: Diagnosis not present

## 2018-08-18 DIAGNOSIS — E785 Hyperlipidemia, unspecified: Secondary | ICD-10-CM | POA: Diagnosis not present

## 2018-08-18 DIAGNOSIS — I10 Essential (primary) hypertension: Secondary | ICD-10-CM | POA: Diagnosis not present

## 2018-08-19 DIAGNOSIS — L570 Actinic keratosis: Secondary | ICD-10-CM | POA: Diagnosis not present

## 2018-08-19 DIAGNOSIS — L219 Seborrheic dermatitis, unspecified: Secondary | ICD-10-CM | POA: Diagnosis not present

## 2018-08-22 DIAGNOSIS — G5632 Lesion of radial nerve, left upper limb: Secondary | ICD-10-CM | POA: Diagnosis not present

## 2018-08-22 DIAGNOSIS — G5622 Lesion of ulnar nerve, left upper limb: Secondary | ICD-10-CM | POA: Diagnosis not present

## 2018-08-22 DIAGNOSIS — G5602 Carpal tunnel syndrome, left upper limb: Secondary | ICD-10-CM | POA: Diagnosis not present

## 2018-09-05 DIAGNOSIS — R197 Diarrhea, unspecified: Secondary | ICD-10-CM | POA: Diagnosis not present

## 2018-09-05 DIAGNOSIS — E119 Type 2 diabetes mellitus without complications: Secondary | ICD-10-CM | POA: Diagnosis not present

## 2018-09-06 DIAGNOSIS — M542 Cervicalgia: Secondary | ICD-10-CM | POA: Diagnosis not present

## 2018-09-06 DIAGNOSIS — S63512D Sprain of carpal joint of left wrist, subsequent encounter: Secondary | ICD-10-CM | POA: Diagnosis not present

## 2018-09-06 DIAGNOSIS — M654 Radial styloid tenosynovitis [de Quervain]: Secondary | ICD-10-CM | POA: Diagnosis not present

## 2018-09-06 DIAGNOSIS — G5602 Carpal tunnel syndrome, left upper limb: Secondary | ICD-10-CM | POA: Diagnosis not present

## 2018-09-26 DIAGNOSIS — M160 Bilateral primary osteoarthritis of hip: Secondary | ICD-10-CM | POA: Diagnosis not present

## 2018-09-26 DIAGNOSIS — M533 Sacrococcygeal disorders, not elsewhere classified: Secondary | ICD-10-CM | POA: Diagnosis not present

## 2018-09-26 DIAGNOSIS — M47816 Spondylosis without myelopathy or radiculopathy, lumbar region: Secondary | ICD-10-CM | POA: Diagnosis not present

## 2018-09-26 DIAGNOSIS — M4727 Other spondylosis with radiculopathy, lumbosacral region: Secondary | ICD-10-CM | POA: Diagnosis not present

## 2018-10-03 DIAGNOSIS — M654 Radial styloid tenosynovitis [de Quervain]: Secondary | ICD-10-CM | POA: Diagnosis not present

## 2018-10-04 DIAGNOSIS — G542 Cervical root disorders, not elsewhere classified: Secondary | ICD-10-CM | POA: Insufficient documentation

## 2018-10-19 DIAGNOSIS — M25551 Pain in right hip: Secondary | ICD-10-CM | POA: Diagnosis not present

## 2018-10-26 DIAGNOSIS — M5412 Radiculopathy, cervical region: Secondary | ICD-10-CM | POA: Diagnosis not present

## 2018-10-26 DIAGNOSIS — M50222 Other cervical disc displacement at C5-C6 level: Secondary | ICD-10-CM | POA: Diagnosis not present

## 2018-10-26 DIAGNOSIS — M542 Cervicalgia: Secondary | ICD-10-CM | POA: Diagnosis not present

## 2018-10-26 DIAGNOSIS — M50221 Other cervical disc displacement at C4-C5 level: Secondary | ICD-10-CM | POA: Diagnosis not present

## 2018-11-01 DIAGNOSIS — M25551 Pain in right hip: Secondary | ICD-10-CM | POA: Diagnosis not present

## 2018-11-09 DIAGNOSIS — M25551 Pain in right hip: Secondary | ICD-10-CM | POA: Diagnosis not present

## 2018-11-30 DIAGNOSIS — E119 Type 2 diabetes mellitus without complications: Secondary | ICD-10-CM | POA: Diagnosis not present

## 2018-11-30 DIAGNOSIS — E785 Hyperlipidemia, unspecified: Secondary | ICD-10-CM | POA: Diagnosis not present

## 2018-11-30 DIAGNOSIS — I10 Essential (primary) hypertension: Secondary | ICD-10-CM | POA: Diagnosis not present

## 2018-12-01 DIAGNOSIS — L219 Seborrheic dermatitis, unspecified: Secondary | ICD-10-CM | POA: Diagnosis not present

## 2018-12-12 DIAGNOSIS — M47816 Spondylosis without myelopathy or radiculopathy, lumbar region: Secondary | ICD-10-CM | POA: Diagnosis not present

## 2018-12-12 DIAGNOSIS — M542 Cervicalgia: Secondary | ICD-10-CM | POA: Diagnosis not present

## 2018-12-26 DIAGNOSIS — M25531 Pain in right wrist: Secondary | ICD-10-CM | POA: Diagnosis not present

## 2019-01-11 DIAGNOSIS — G5601 Carpal tunnel syndrome, right upper limb: Secondary | ICD-10-CM | POA: Diagnosis not present

## 2019-01-13 DIAGNOSIS — M25531 Pain in right wrist: Secondary | ICD-10-CM | POA: Diagnosis not present

## 2019-01-17 DIAGNOSIS — G5602 Carpal tunnel syndrome, left upper limb: Secondary | ICD-10-CM | POA: Diagnosis not present

## 2019-01-17 DIAGNOSIS — M25531 Pain in right wrist: Secondary | ICD-10-CM | POA: Diagnosis not present

## 2019-01-17 DIAGNOSIS — G5601 Carpal tunnel syndrome, right upper limb: Secondary | ICD-10-CM | POA: Diagnosis not present

## 2019-01-18 DIAGNOSIS — M25551 Pain in right hip: Secondary | ICD-10-CM | POA: Diagnosis not present

## 2019-02-21 DIAGNOSIS — G5603 Carpal tunnel syndrome, bilateral upper limbs: Secondary | ICD-10-CM | POA: Diagnosis not present

## 2019-02-22 DIAGNOSIS — Z125 Encounter for screening for malignant neoplasm of prostate: Secondary | ICD-10-CM | POA: Diagnosis not present

## 2019-02-22 DIAGNOSIS — Z0000 Encounter for general adult medical examination without abnormal findings: Secondary | ICD-10-CM | POA: Diagnosis not present

## 2019-02-22 DIAGNOSIS — R8290 Unspecified abnormal findings in urine: Secondary | ICD-10-CM | POA: Diagnosis not present

## 2019-02-23 DIAGNOSIS — M25532 Pain in left wrist: Secondary | ICD-10-CM | POA: Diagnosis not present

## 2019-02-23 DIAGNOSIS — M25531 Pain in right wrist: Secondary | ICD-10-CM | POA: Diagnosis not present

## 2019-02-24 DIAGNOSIS — M25551 Pain in right hip: Secondary | ICD-10-CM | POA: Diagnosis not present

## 2019-02-28 DIAGNOSIS — M5136 Other intervertebral disc degeneration, lumbar region: Secondary | ICD-10-CM | POA: Diagnosis not present

## 2019-02-28 DIAGNOSIS — M533 Sacrococcygeal disorders, not elsewhere classified: Secondary | ICD-10-CM | POA: Diagnosis not present

## 2019-03-09 DIAGNOSIS — L570 Actinic keratosis: Secondary | ICD-10-CM | POA: Diagnosis not present

## 2019-03-09 DIAGNOSIS — L219 Seborrheic dermatitis, unspecified: Secondary | ICD-10-CM | POA: Diagnosis not present

## 2019-03-13 DIAGNOSIS — G5603 Carpal tunnel syndrome, bilateral upper limbs: Secondary | ICD-10-CM | POA: Diagnosis not present

## 2019-03-14 DIAGNOSIS — E1165 Type 2 diabetes mellitus with hyperglycemia: Secondary | ICD-10-CM | POA: Diagnosis not present

## 2019-03-14 DIAGNOSIS — I10 Essential (primary) hypertension: Secondary | ICD-10-CM | POA: Diagnosis not present

## 2019-03-14 DIAGNOSIS — Z0000 Encounter for general adult medical examination without abnormal findings: Secondary | ICD-10-CM | POA: Diagnosis not present

## 2019-03-23 DIAGNOSIS — G5602 Carpal tunnel syndrome, left upper limb: Secondary | ICD-10-CM | POA: Diagnosis not present

## 2019-03-29 DIAGNOSIS — M50221 Other cervical disc displacement at C4-C5 level: Secondary | ICD-10-CM | POA: Diagnosis not present

## 2019-03-29 DIAGNOSIS — M50222 Other cervical disc displacement at C5-C6 level: Secondary | ICD-10-CM | POA: Diagnosis not present

## 2019-03-29 DIAGNOSIS — I10 Essential (primary) hypertension: Secondary | ICD-10-CM | POA: Diagnosis not present

## 2019-03-29 DIAGNOSIS — M5412 Radiculopathy, cervical region: Secondary | ICD-10-CM | POA: Diagnosis not present

## 2019-03-30 DIAGNOSIS — M25551 Pain in right hip: Secondary | ICD-10-CM | POA: Diagnosis not present

## 2019-04-04 DIAGNOSIS — E559 Vitamin D deficiency, unspecified: Secondary | ICD-10-CM | POA: Diagnosis not present

## 2019-04-04 DIAGNOSIS — E785 Hyperlipidemia, unspecified: Secondary | ICD-10-CM | POA: Diagnosis not present

## 2019-04-04 DIAGNOSIS — E118 Type 2 diabetes mellitus with unspecified complications: Secondary | ICD-10-CM | POA: Diagnosis not present

## 2019-04-04 DIAGNOSIS — I10 Essential (primary) hypertension: Secondary | ICD-10-CM | POA: Diagnosis not present

## 2019-04-04 DIAGNOSIS — M542 Cervicalgia: Secondary | ICD-10-CM | POA: Diagnosis not present

## 2019-04-04 DIAGNOSIS — M50221 Other cervical disc displacement at C4-C5 level: Secondary | ICD-10-CM | POA: Diagnosis not present

## 2019-04-04 DIAGNOSIS — E1165 Type 2 diabetes mellitus with hyperglycemia: Secondary | ICD-10-CM | POA: Diagnosis not present

## 2019-04-11 DIAGNOSIS — M25551 Pain in right hip: Secondary | ICD-10-CM | POA: Diagnosis not present

## 2019-04-25 DIAGNOSIS — M25551 Pain in right hip: Secondary | ICD-10-CM | POA: Diagnosis not present

## 2019-04-28 IMAGING — CT CT ANGIO CHEST
2 of 8 series · 18 of 46 positions shown · IV contrast (ISOVUE 370)
Comparison: CT scan of February 05, 2015.

CLINICAL DATA: Dyspnea.

EXAM:
CT ANGIOGRAPHY CHEST WITH CONTRAST
TECHNIQUE: Multidetector CT imaging of the chest was performed using the
standard protocol during bolus administration of intravenous
contrast. Multiplanar CT image reconstructions and MIPs were
obtained to evaluate the vascular anatomy.
CONTRAST:  80 mL of Isovue 370 intravenously.

[Series 5: thins · axial · 0.75mm/px · z∈[-273,-39]mm · 15 of 264 slices shown]
[im 15/264  lung]
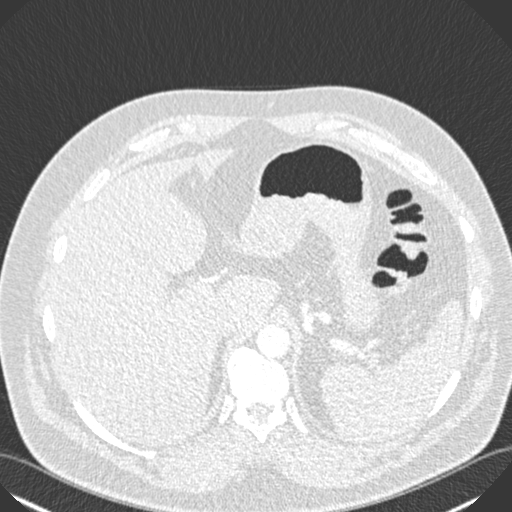
[im 30/264  soft-tissue]
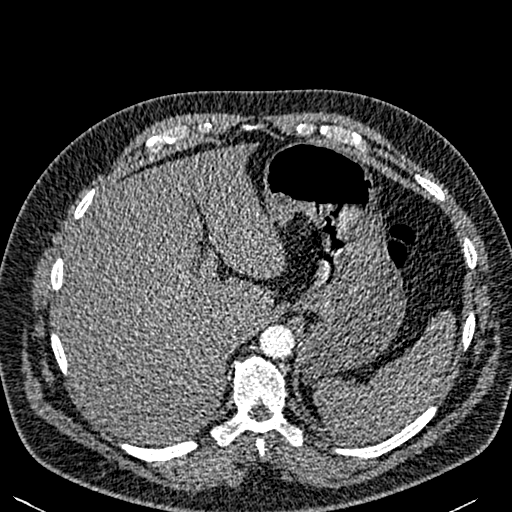
[im 44/264  lung]
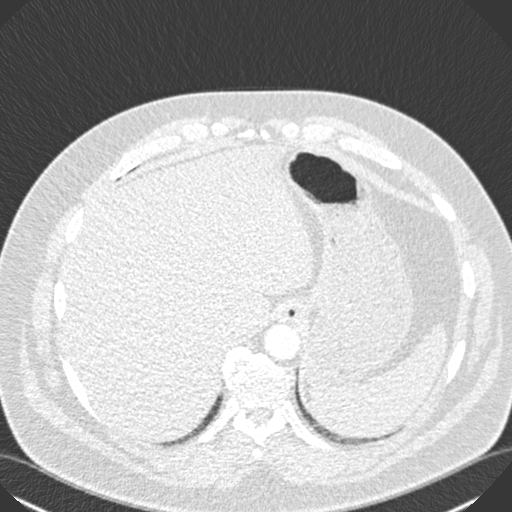
[im 59/264  soft-tissue]
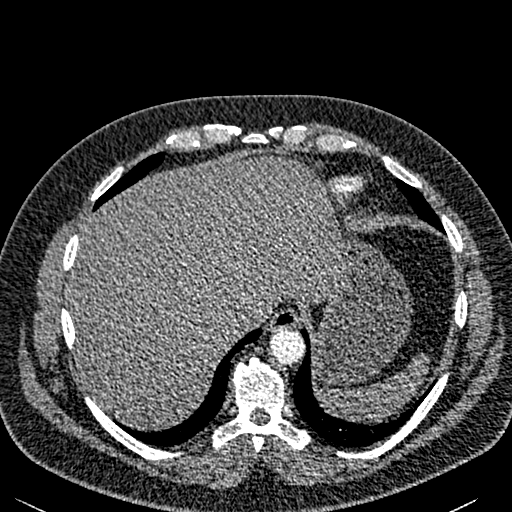
[im 88/264  lung]
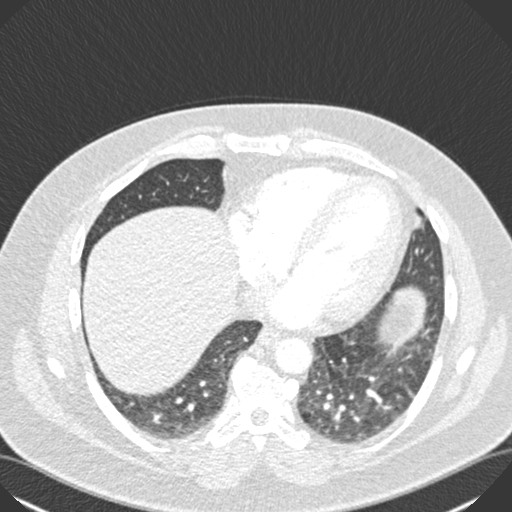
[im 103/264  soft-tissue]
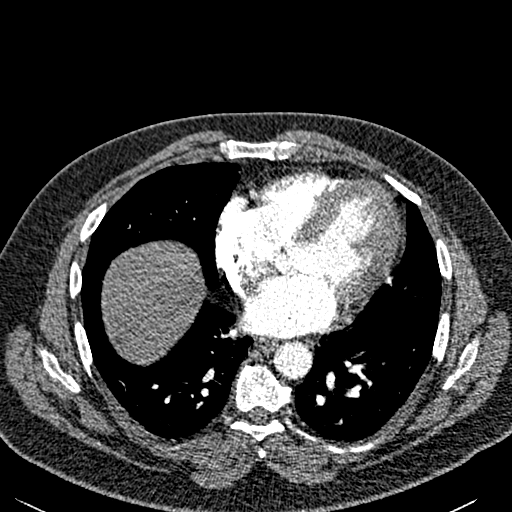
[im 117/264  lung]
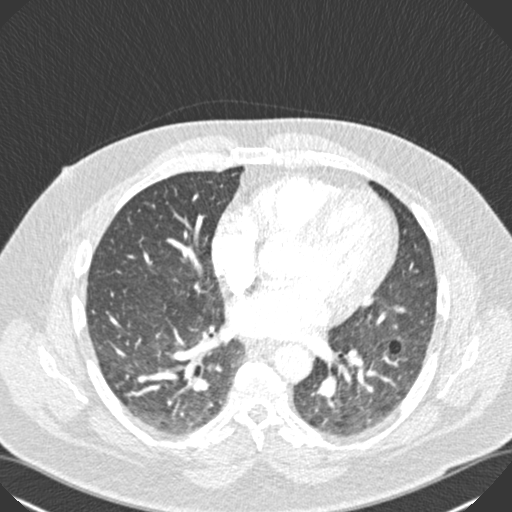
[im 132/264  soft-tissue]
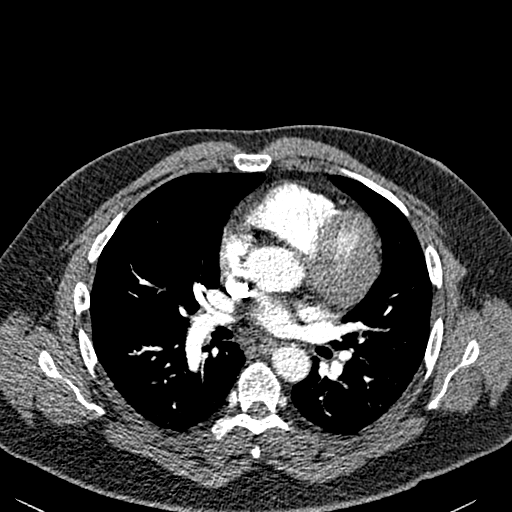
[im 147/264  lung]
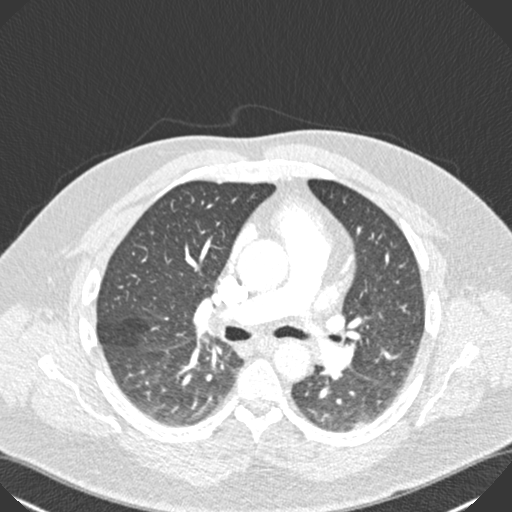
[im 161/264  soft-tissue]
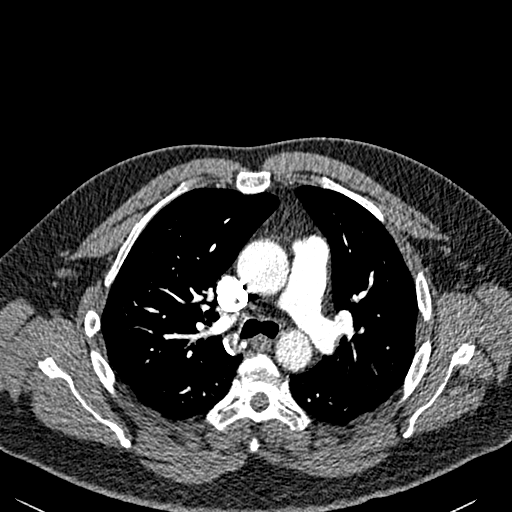
[im 176/264  lung]
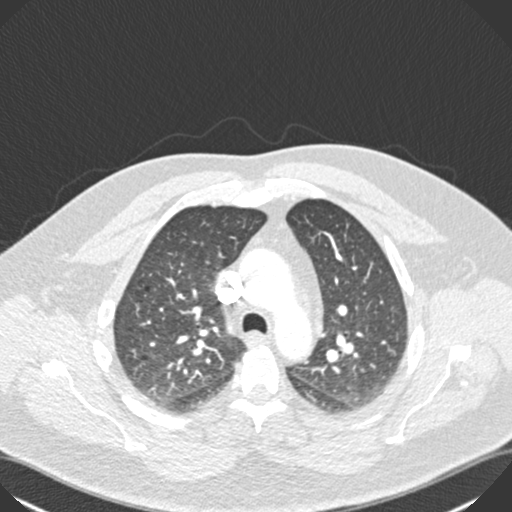
[im 205/264  soft-tissue]
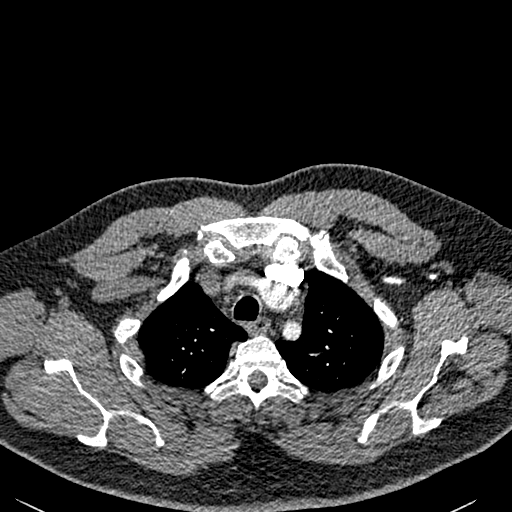
[im 220/264  lung]
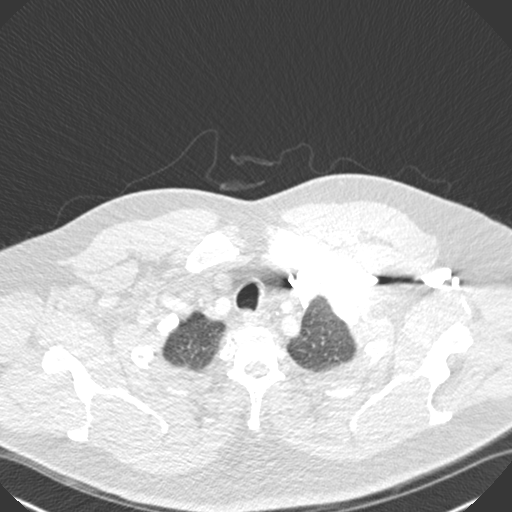
[im 234/264  soft-tissue]
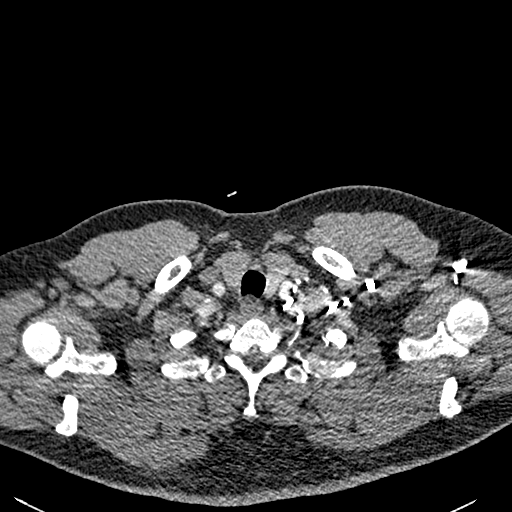
[im 249/264  lung]
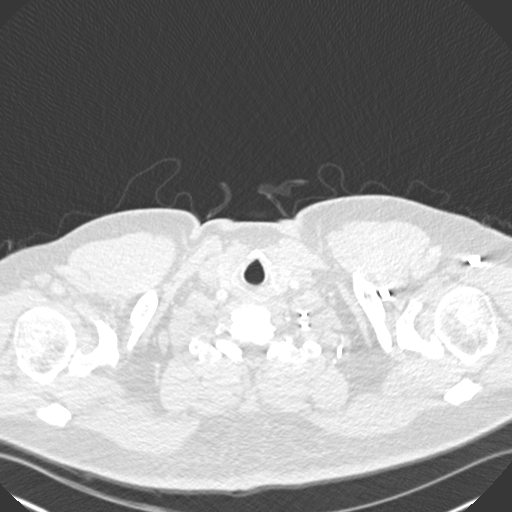

[Series 7: coronal mpr · coronal · 0.52mm/px · 3 of 138 slices shown]
[im 35/138  soft-tissue]
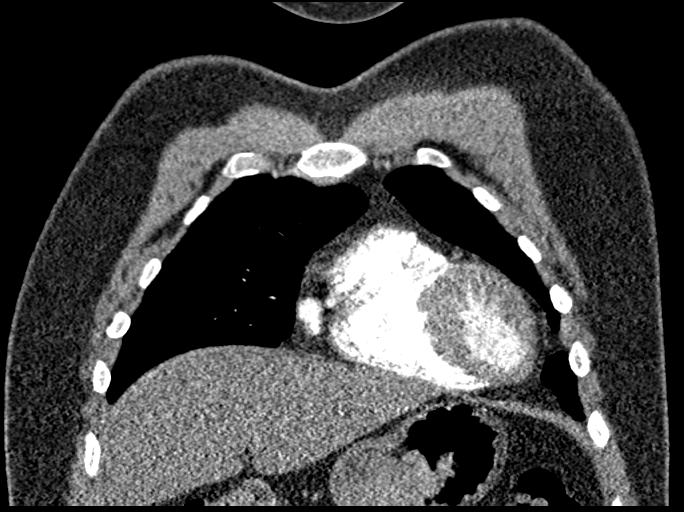
[im 69/138  soft-tissue]
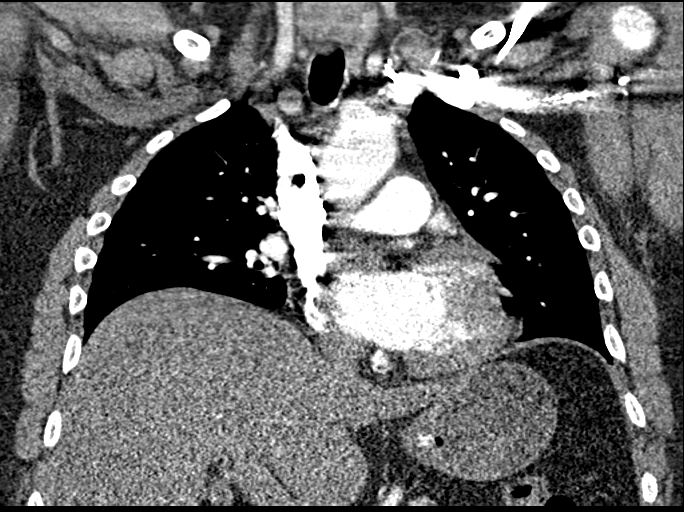
[im 103/138  soft-tissue]
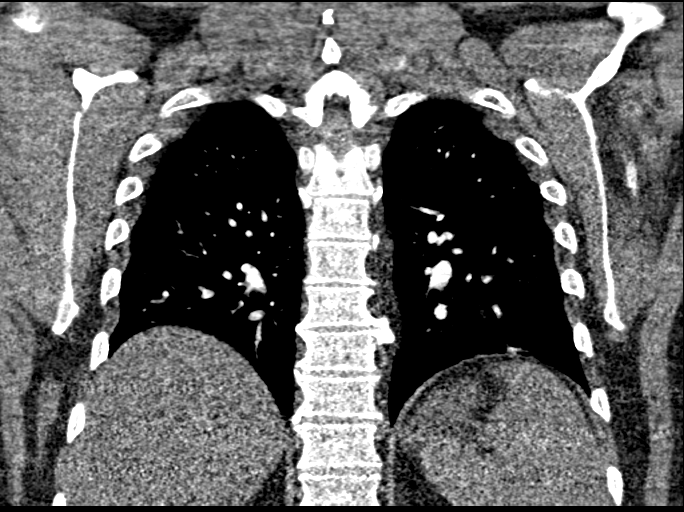

[18 of 46 positions shown; findings below may reference images not displayed]

FINDINGS: Cardiovascular: Satisfactory opacification of the pulmonary arteries
to the segmental level. No evidence of pulmonary embolism. Normal
heart size. No pericardial effusion.

Mediastinum/Nodes: No enlarged mediastinal, hilar, or axillary lymph
nodes. Thyroid gland, trachea, and esophagus demonstrate no
significant findings.

Lungs/Pleura: Lungs are clear. No pleural effusion or pneumothorax.

Upper Abdomen: No acute abnormality.

Musculoskeletal: No chest wall abnormality. No acute or significant
osseous findings.

Review of the MIP images confirms the above findings.
IMPRESSION: No evidence of pulmonary embolus. No acute cardiopulmonary
abnormality seen.

## 2019-05-03 DIAGNOSIS — M25551 Pain in right hip: Secondary | ICD-10-CM | POA: Diagnosis not present

## 2019-05-10 DIAGNOSIS — M47816 Spondylosis without myelopathy or radiculopathy, lumbar region: Secondary | ICD-10-CM | POA: Diagnosis not present

## 2019-05-10 DIAGNOSIS — M50221 Other cervical disc displacement at C4-C5 level: Secondary | ICD-10-CM | POA: Diagnosis not present

## 2019-05-10 DIAGNOSIS — I10 Essential (primary) hypertension: Secondary | ICD-10-CM | POA: Diagnosis not present

## 2019-05-10 DIAGNOSIS — M5136 Other intervertebral disc degeneration, lumbar region: Secondary | ICD-10-CM | POA: Diagnosis not present

## 2019-05-11 DIAGNOSIS — M25551 Pain in right hip: Secondary | ICD-10-CM | POA: Diagnosis not present

## 2019-05-24 DIAGNOSIS — M25551 Pain in right hip: Secondary | ICD-10-CM | POA: Diagnosis not present

## 2019-05-29 DIAGNOSIS — M25551 Pain in right hip: Secondary | ICD-10-CM | POA: Diagnosis not present

## 2019-06-05 DIAGNOSIS — D3142 Benign neoplasm of left ciliary body: Secondary | ICD-10-CM | POA: Diagnosis not present

## 2019-06-05 DIAGNOSIS — M25551 Pain in right hip: Secondary | ICD-10-CM | POA: Diagnosis not present

## 2019-06-05 DIAGNOSIS — Z83511 Family history of glaucoma: Secondary | ICD-10-CM | POA: Diagnosis not present

## 2019-06-05 DIAGNOSIS — E119 Type 2 diabetes mellitus without complications: Secondary | ICD-10-CM | POA: Diagnosis not present

## 2019-06-19 DIAGNOSIS — M25551 Pain in right hip: Secondary | ICD-10-CM | POA: Diagnosis not present

## 2019-06-20 DIAGNOSIS — R809 Proteinuria, unspecified: Secondary | ICD-10-CM | POA: Diagnosis not present

## 2019-06-20 DIAGNOSIS — E1165 Type 2 diabetes mellitus with hyperglycemia: Secondary | ICD-10-CM | POA: Diagnosis not present

## 2019-06-20 DIAGNOSIS — I10 Essential (primary) hypertension: Secondary | ICD-10-CM | POA: Diagnosis not present

## 2019-06-20 DIAGNOSIS — E118 Type 2 diabetes mellitus with unspecified complications: Secondary | ICD-10-CM | POA: Diagnosis not present

## 2019-06-20 DIAGNOSIS — E785 Hyperlipidemia, unspecified: Secondary | ICD-10-CM | POA: Diagnosis not present

## 2019-06-22 DIAGNOSIS — M533 Sacrococcygeal disorders, not elsewhere classified: Secondary | ICD-10-CM | POA: Diagnosis not present

## 2019-06-22 DIAGNOSIS — M50222 Other cervical disc displacement at C5-C6 level: Secondary | ICD-10-CM | POA: Diagnosis not present

## 2019-06-22 DIAGNOSIS — M5136 Other intervertebral disc degeneration, lumbar region: Secondary | ICD-10-CM | POA: Diagnosis not present

## 2019-06-22 DIAGNOSIS — M47816 Spondylosis without myelopathy or radiculopathy, lumbar region: Secondary | ICD-10-CM | POA: Diagnosis not present

## 2019-06-23 DIAGNOSIS — M25551 Pain in right hip: Secondary | ICD-10-CM | POA: Diagnosis not present

## 2019-06-28 DIAGNOSIS — M25551 Pain in right hip: Secondary | ICD-10-CM | POA: Diagnosis not present

## 2019-07-04 DIAGNOSIS — M25551 Pain in right hip: Secondary | ICD-10-CM | POA: Diagnosis not present

## 2019-07-10 DIAGNOSIS — M25551 Pain in right hip: Secondary | ICD-10-CM | POA: Diagnosis not present

## 2019-07-20 DIAGNOSIS — M25551 Pain in right hip: Secondary | ICD-10-CM | POA: Diagnosis not present

## 2019-07-24 DIAGNOSIS — D229 Melanocytic nevi, unspecified: Secondary | ICD-10-CM | POA: Diagnosis not present

## 2019-07-24 DIAGNOSIS — L570 Actinic keratosis: Secondary | ICD-10-CM | POA: Diagnosis not present

## 2019-07-26 DIAGNOSIS — M25551 Pain in right hip: Secondary | ICD-10-CM | POA: Diagnosis not present

## 2019-08-04 DIAGNOSIS — M25551 Pain in right hip: Secondary | ICD-10-CM | POA: Diagnosis not present

## 2019-08-07 DIAGNOSIS — M25551 Pain in right hip: Secondary | ICD-10-CM | POA: Diagnosis not present

## 2019-08-07 DIAGNOSIS — M542 Cervicalgia: Secondary | ICD-10-CM | POA: Diagnosis not present

## 2019-08-07 DIAGNOSIS — M50222 Other cervical disc displacement at C5-C6 level: Secondary | ICD-10-CM | POA: Diagnosis not present

## 2019-08-07 DIAGNOSIS — M50221 Other cervical disc displacement at C4-C5 level: Secondary | ICD-10-CM | POA: Diagnosis not present

## 2019-08-07 DIAGNOSIS — M5412 Radiculopathy, cervical region: Secondary | ICD-10-CM | POA: Diagnosis not present

## 2019-08-10 DIAGNOSIS — E119 Type 2 diabetes mellitus without complications: Secondary | ICD-10-CM | POA: Diagnosis not present

## 2019-08-10 DIAGNOSIS — I10 Essential (primary) hypertension: Secondary | ICD-10-CM | POA: Diagnosis not present

## 2019-08-10 DIAGNOSIS — E1165 Type 2 diabetes mellitus with hyperglycemia: Secondary | ICD-10-CM | POA: Diagnosis not present

## 2019-08-10 DIAGNOSIS — R809 Proteinuria, unspecified: Secondary | ICD-10-CM | POA: Diagnosis not present

## 2019-08-11 DIAGNOSIS — M50221 Other cervical disc displacement at C4-C5 level: Secondary | ICD-10-CM | POA: Diagnosis not present

## 2019-09-07 DIAGNOSIS — M5412 Radiculopathy, cervical region: Secondary | ICD-10-CM | POA: Diagnosis not present

## 2019-09-07 DIAGNOSIS — Z6828 Body mass index (BMI) 28.0-28.9, adult: Secondary | ICD-10-CM | POA: Diagnosis not present

## 2019-09-07 DIAGNOSIS — I10 Essential (primary) hypertension: Secondary | ICD-10-CM | POA: Diagnosis not present

## 2019-09-07 DIAGNOSIS — M50221 Other cervical disc displacement at C4-C5 level: Secondary | ICD-10-CM | POA: Diagnosis not present

## 2019-09-13 DIAGNOSIS — M25551 Pain in right hip: Secondary | ICD-10-CM | POA: Diagnosis not present

## 2019-09-18 ENCOUNTER — Telehealth: Payer: Self-pay | Admitting: Cardiovascular Disease

## 2019-09-18 NOTE — Telephone Encounter (Addendum)
   [facility] Pre-operative Risk Assessment    Request for surgical clearance:  1. What type of surgery is being performed? C4-5, C5-6 artificial disc replacement    2. When is this surgery scheduled? Sep 26, 2019  3. What type of clearance is required (medical clearance vs. Pharmacy clearance to hold med vs. Both)? Medical   4. Are there any medications that need to be held prior to surgery and how long? None specified - on ASA 38m   5. Practice name and name of physician performing surgery? Dr. [facility]line [facility]   6. What is your office phone number [phone]  7.   What is your office fax number [phone]: JJanett Billow 8.   Anesthesia type (None, local, MAC, general) ?  Unsure, per form   ESheral ApleyM 09/18/2019, 12:40 PM  _________________________________________________________________   (provider comments below)

## 2019-09-18 NOTE — Telephone Encounter (Signed)
   Primary Cardiologist:Jonathan Gwenlyn Found, MD  Chart reviewed as part of pre-operative protocol coverage. Because of Arvo Renter Shelton's past medical history and time since last visit, he/she will require a follow-up visit in order to better assess preoperative cardiovascular risk.  This patient has not been seen by our service since 2018.  Pre-op covering staff: - Please schedule appointment and call patient to inform them. - Please contact requesting surgeon's office via preferred method (i.e, phone, fax) to inform them of need for appointment prior to surgery.  If applicable, this message will also be routed to pharmacy pool and/or primary cardiologist for input on holding anticoagulant/antiplatelet agent as requested below so that this information is available at time of patient's appointment.   Kathyrn Drown, NP  09/18/2019, 1:19 PM

## 2019-09-19 ENCOUNTER — Encounter: Payer: Self-pay | Admitting: Cardiovascular Disease

## 2019-09-19 ENCOUNTER — Ambulatory Visit: Payer: BC Managed Care – PPO | Admitting: Cardiovascular Disease

## 2019-09-19 ENCOUNTER — Other Ambulatory Visit: Payer: Self-pay

## 2019-09-19 VITALS — BP 128/70 | HR 77 | Temp 97.7°F | Ht 70.0 in | Wt 204.0 lb

## 2019-09-19 DIAGNOSIS — I10 Essential (primary) hypertension: Secondary | ICD-10-CM

## 2019-09-19 DIAGNOSIS — E782 Mixed hyperlipidemia: Secondary | ICD-10-CM

## 2019-09-19 DIAGNOSIS — I82403 Acute embolism and thrombosis of unspecified deep veins of lower extremity, bilateral: Secondary | ICD-10-CM | POA: Diagnosis not present

## 2019-09-19 NOTE — Assessment & Plan Note (Signed)
History of hyperlipidemia on therapy with lipid profile performed 11/30/2018 revealing total cholesterol 135, LDL 68 HDL 39.

## 2019-09-19 NOTE — Assessment & Plan Note (Signed)
History of essential hypertension with blood pressure measured today of 120/70.  He is on lisinopril p.o. twice daily.

## 2019-09-19 NOTE — Patient Instructions (Signed)
Medication Instructions:  Your physician recommends that you continue on your current medications as directed. Please refer to the Current Medication list given to you today.  If you need a refill on your cardiac medications before your next appointment, please call your pharmacy.   Lab work: NONE  Testing/Procedures: NONE  Follow-Up: At [facility], you and your health needs are our priority.  As part of our continuing mission to provide you with exceptional heart care, we have created designated Provider Care Teams.  These Care Teams include your primary Cardiologist (physician) and Advanced Practice Providers (APPs -  Physician Assistants and Nurse Practitioners) who all work together to provide you with the care you need, when you need it. You may see Quay Burow, MD or one of the following Advanced Practice Providers on your designated Care Team:    Kerin Ransom, PA-C  [place], [place]  Coletta Memos, [place]  Your physician wants you to follow-up in: 1 year with Dr. Gwenlyn Found

## 2019-09-19 NOTE — Telephone Encounter (Signed)
I left a message for the pt to call the office as he will need to schedule an appt with Dr. Gwenlyn Found or his care team for pre op clearance.

## 2019-09-19 NOTE — Progress Notes (Signed)
Gordon Allen     09/19/2019 Gordon Allen   06/23/1966  Gordon Allen  Primary Physician Gordon Stalker, PA-C Primary Cardiologist: Gordon Harp MD Gordon Allen, [place]  HPI:  Gordon Allen is a 53 y.o.  moderately overweight single Caucasian male patient of Gordon Stalker, PA-C who was formally a patient of Dr. Merri Ray. I last saw him in the hot office  01/06/2017. He has a history of treated hypertension, diabetes and hyperlipidemia. He did have a right popliteal DVT back in 2013 about this on oral after graduation for 18 months which was then stopped. He apparently had a thrombophilia workup and was not told that he had any predisposition for this clotting. He had negative stress test back in 2009. He had a pneumonia treated approximately 2 months ago and about X and presented to [facility] over the summer with shortness of breath and dizziness. A chest CT was unrevealing. Since I saw him back [place] and well until recently. He was complaining of some orthopnea and some swelling of his lower extremities. He saw his PCP who will order blood work including a d-dimer was minimally elevated. This led to a venous duplex study yesterday that showed right superficial femoral vein DVT. He has also been complaining of some lower extremity edema right greater than left which has since resolved. A 2-D echocardiogram performed 03/13/15 was essentially normal as was an echo performed 01/18/2017.  Myoview stress test performed 03/12/2015 was normal as well.  He has done well over the last several years and has lost 36 pounds as a result of intermittent fasting.  He apparently needs cervical discectomy scheduled to be performed by Dr. Vertell Limber next week via the anterior approach.  He will be cleared at low risk.        Current Outpatient Prescriptions      Current Meds  Medication Sig  . atorvastatin (LIPITOR) 20 MG tablet Take 20 mg by mouth daily.  [name] JARDIANCE 25 MG TABS tablet  Take 1 tablet by mouth daily.  [name] lisinopril (PRINIVIL,ZESTRIL) 20 MG tablet Take 20 mg by mouth 2 (two) times daily.  . metFORMIN (GLUCOPHAGE) 1000 MG tablet Take 1,000 mg by mouth 2 (two) times daily with a meal.  . omeprazole (PRILOSEC) 40 MG capsule Take 1 capsule (40 mg total) by mouth daily.  [name] oxymorphone (OPANA) 5 MG tablet Take 5 mg by mouth every 6 (six) hours as needed for pain.  . polyethylene glycol (MIRALAX / GLYCOLAX) packet Take 17 g by mouth daily as needed (for constipation).     Allergies  Allergen Reactions  . Diclofenac Sodium Other (See Comments)    Increased gas  . Gabapentin Other (See Comments)    Severe diarrhea  . Oxycodone-Acetaminophen Nausea Only  . Tramadol Nausea And Vomiting and Other (See Comments)    nausea    Social History   Socioeconomic History  . Marital status: Single    Spouse name: Not on file  . Number of children: 0  . Years of education: Not on file  . Highest education level: Not on file  Occupational History  . Occupation: landscape  Tobacco Use  . Smoking status: Never Smoker  . Smokeless tobacco: Never Used  Substance and Sexual Activity  . Alcohol use: No  . Drug use: No  . Sexual activity: Not on file  Other Topics Concern  . Not on file  Social History Narrative  . Not on file  Social Determinants of Health   Financial Resource Strain:   . Difficulty of Paying Living Expenses: Not on file  Food Insecurity:   . Worried About Charity fundraiser in the Last Year: Not on file  . Ran Out of Food in the Last Year: Not on file  Transportation Needs:   . Lack of Transportation (Medical): Not on file  . Lack of Transportation (Non-Medical): Not on file  Physical Activity:   . Days of Exercise per Week: Not on file  . Minutes of Exercise per Session: Not on file  Stress:   . Feeling of Stress : Not on file  Social Connections:   . Frequency of Communication with Friends and Family: Not on file  . Frequency of Social  Gatherings with Friends and Family: Not on file  . Attends Religious Services: Not on file  . Active Member of Clubs or Organizations: Not on file  . Attends Archivist Meetings: Not on file  . Marital Status: Not on file  Intimate Partner Violence:   . Fear of Current or Ex-Partner: Not on file  . Emotionally Abused: Not on file  . Physically Abused: Not on file  . Sexually Abused: Not on file     Review of Systems: General: negative for chills, fever, night sweats or weight changes.  Cardiovascular: negative for chest pain, dyspnea on exertion, edema, orthopnea, palpitations, paroxysmal nocturnal dyspnea or shortness of breath Dermatological: negative for rash Respiratory: negative for cough or wheezing Urologic: negative for hematuria Abdominal: negative for nausea, vomiting, diarrhea, bright red blood per rectum, melena, or hematemesis Neurologic: negative for visual changes, syncope, or dizziness All other systems reviewed and are otherwise negative except as noted above.    Blood pressure 128/70, pulse 77, temperature 97.7 F (36.5 C), height 5\' 10"  (1.778 m), weight 204 lb (92.5 kg).  General appearance: alert and no distress Neck: no adenopathy, no carotid bruit, no JVD, supple, symmetrical, trachea midline and thyroid not enlarged, symmetric, no tenderness/mass/nodules Lungs: clear to auscultation bilaterally Heart: regular rate and rhythm, S1, S2 normal, no murmur, click, rub or gallop Extremities: extremities normal, atraumatic, no cyanosis or edema Pulses: 2+ and symmetric Skin: Skin color, texture, turgor normal. No rashes or lesions Neurologic: Alert and oriented X 3, normal strength and tone. Normal symmetric reflexes. Normal coordination and gait  EKG sinus rhythm at 77 without ST or T wave changes. I  Personally reviewed this EKG.  ASSESSMENT AND PLAN:   DVT (deep venous thrombosis) (HCC) History of remote distal right superficial femoral vein and  popliteal vein DVT which is currently asymptomatic not on oral anticoagulation.  Essential hypertension History of essential hypertension with blood pressure measured today of 120/70.  He is on lisinopril p.o. twice daily.   Hyperlipidemia History of hyperlipidemia on therapy with lipid profile performed 11/30/2018 revealing total cholesterol 135, LDL 68 HDL 39.      Gordon Harp MD [facility], [facility] 09/19/2019 4:45 PM

## 2019-09-19 NOTE — Telephone Encounter (Signed)
Pt is seeing Dr. Gwenlyn Found today. I will forward clearance notes to MD for appt for surgery clearance. I will remove from the pre op call back pool.

## 2019-09-19 NOTE — Assessment & Plan Note (Signed)
History of remote distal right superficial femoral vein and popliteal vein DVT which is currently asymptomatic not on oral anticoagulation.

## 2019-09-20 NOTE — Telephone Encounter (Signed)
   Primary Cardiologist: Quay Burow, MD  Chart reviewed as part of pre-operative protocol coverage. Given past medical history and time since last visit, based on ACC/AHA guidelines, Gordon Allen would be at acceptable risk for the planned procedure without further cardiovascular testing.   Gordon Allen was seen in our office on 09/19/2019 by Dr. Gwenlyn Found and deemed low risk for the procedure. Please see office note if further details needed.   I will route this recommendation to the requesting party via Epic fax function and remove from pre-op pool.  Please call with questions.  Daune Perch, NP 09/20/2019, 10:17 AM

## 2019-09-21 ENCOUNTER — Ambulatory Visit: Payer: BC Managed Care – PPO | Attending: Internal Medicine

## 2019-09-21 DIAGNOSIS — Z23 Encounter for immunization: Secondary | ICD-10-CM | POA: Insufficient documentation

## 2019-09-21 NOTE — Progress Notes (Signed)
   Covid-19 Vaccination Clinic  Name:  Gordon Allen    MRN: [id number] DOB: [birth year]  09/21/2019  [name] was observed post Covid-19 immunization for 15 minutes without incidence. He was provided with Vaccine Information Sheet and instruction to access the V-Safe system.   [name] was instructed to call 911 with any severe reactions post vaccine: [name] Difficulty breathing  . Swelling of your face and throat  . A fast heartbeat  . A bad rash all over your body  . Dizziness and weakness    Immunizations Administered    Name Date Dose VIS Date Route   Pfizer COVID-19 Vaccine 09/21/2019  1:40 PM 0.3 mL 08/11/2019 Intramuscular   Manufacturer: [place]   Lot: [id number]   [address]: [id number]

## 2019-09-25 ENCOUNTER — Ambulatory Visit: Payer: BC Managed Care – PPO | Attending: Internal Medicine

## 2019-09-25 DIAGNOSIS — Z20822 Contact with and (suspected) exposure to covid-19: Secondary | ICD-10-CM

## 2019-09-26 LAB — NOVEL CORONAVIRUS, NAA: SARS-CoV-2, NAA: NOT DETECTED

## 2019-10-04 DIAGNOSIS — E1165 Type 2 diabetes mellitus with hyperglycemia: Secondary | ICD-10-CM | POA: Diagnosis not present

## 2019-10-04 DIAGNOSIS — K219 Gastro-esophageal reflux disease without esophagitis: Secondary | ICD-10-CM | POA: Diagnosis not present

## 2019-10-04 DIAGNOSIS — I10 Essential (primary) hypertension: Secondary | ICD-10-CM | POA: Diagnosis not present

## 2019-10-04 DIAGNOSIS — M25551 Pain in right hip: Secondary | ICD-10-CM | POA: Diagnosis not present

## 2019-10-06 DIAGNOSIS — M50121 Cervical disc disorder at C4-C5 level with radiculopathy: Secondary | ICD-10-CM | POA: Diagnosis not present

## 2019-10-06 DIAGNOSIS — M4722 Other spondylosis with radiculopathy, cervical region: Secondary | ICD-10-CM | POA: Diagnosis not present

## 2019-10-06 DIAGNOSIS — M4802 Spinal stenosis, cervical region: Secondary | ICD-10-CM | POA: Diagnosis not present

## 2019-10-06 DIAGNOSIS — M50221 Other cervical disc displacement at C4-C5 level: Secondary | ICD-10-CM | POA: Diagnosis not present

## 2019-10-12 ENCOUNTER — Ambulatory Visit: Payer: BC Managed Care – PPO | Attending: Internal Medicine

## 2019-10-12 DIAGNOSIS — Z23 Encounter for immunization: Secondary | ICD-10-CM

## 2019-10-12 NOTE — Progress Notes (Signed)
   Covid-19 Vaccination Clinic  Name:  Gordon Allen    MRN: [id number] DOB: June 18, 1966  10/12/2019  [name] was observed post Covid-19 immunization for 15 minutes without incidence. He was provided with Vaccine Information Sheet and instruction to access the V-Safe system.   [name] was instructed to call 911 with any severe reactions post vaccine: [name] Difficulty breathing  . Swelling of your face and throat  . A fast heartbeat  . A bad rash all over your body  . Dizziness and weakness    Immunizations Administered    Name Date Dose VIS Date Route   Pfizer COVID-19 Vaccine 10/12/2019  1:21 PM 0.3 mL 08/11/2019 Intramuscular   Manufacturer: [place]   Lot: [id number]   [place]: [id number]

## 2019-10-23 DIAGNOSIS — M542 Cervicalgia: Secondary | ICD-10-CM | POA: Diagnosis not present

## 2019-10-31 DIAGNOSIS — M25551 Pain in right hip: Secondary | ICD-10-CM | POA: Diagnosis not present

## 2019-10-31 DIAGNOSIS — M160 Bilateral primary osteoarthritis of hip: Secondary | ICD-10-CM | POA: Diagnosis not present

## 2019-11-01 DIAGNOSIS — M25551 Pain in right hip: Secondary | ICD-10-CM | POA: Diagnosis not present

## 2019-11-09 DIAGNOSIS — M25551 Pain in right hip: Secondary | ICD-10-CM | POA: Diagnosis not present

## 2019-11-13 DIAGNOSIS — M25551 Pain in right hip: Secondary | ICD-10-CM | POA: Diagnosis not present

## 2019-11-15 DIAGNOSIS — M25551 Pain in right hip: Secondary | ICD-10-CM | POA: Diagnosis not present

## 2019-11-20 DIAGNOSIS — M25551 Pain in right hip: Secondary | ICD-10-CM | POA: Diagnosis not present

## 2019-11-23 ENCOUNTER — Ambulatory Visit: Payer: BC Managed Care – PPO | Admitting: Physician Assistant

## 2019-11-23 ENCOUNTER — Other Ambulatory Visit: Payer: Self-pay

## 2019-11-23 ENCOUNTER — Encounter: Payer: Self-pay | Admitting: Physician Assistant

## 2019-11-23 DIAGNOSIS — L219 Seborrheic dermatitis, unspecified: Secondary | ICD-10-CM

## 2019-11-23 DIAGNOSIS — L570 Actinic keratosis: Secondary | ICD-10-CM

## 2019-11-23 NOTE — Patient Instructions (Addendum)
Use Fluorouracil nightly for one week.  If not too irritated repeat for another week; however, if too irritated wait for three weeks before repeating nightly for a week.  Follow-up at the end of May.

## 2019-11-23 NOTE — Progress Notes (Signed)
   Follow up Visit  Subjective  [name] is a 54 y.o. male who presents for the following: Skin Problem (check some spots on head and scalp, Pimple like spots. They are also tender and they wont come off. Paients forehead is also more freckly. ). Is wondering if there is a blood test for skin cancer. He has had multiple scaling lesions on the forehead and scalp. His scalp is tender at times and he feels like the coloration on his forehead has changed.  Objective  Well appearing patient in no apparent distress; mood and affect are within normal limits.  All skin waist up examined. No suspicious moles noted on back.   Objective  Head - Anterior (Face), Mid Frontal Scalp: Erythematous patches with gritty scale.  Objective  Head - Anterior (Face): Erythematous plaques with greasy scale.   Assessment & Plan  AK (actinic keratosis) (2) Head - Anterior (Face); Mid Frontal Scalp  Pt has fluorouracil and will use it as he has in the past.  Seborrheic dermatitis Head - Anterior (Face)  Continue his current topicals. It is controlled at this time. Nizoral shampoo, xolegel.

## 2019-11-24 DIAGNOSIS — M25551 Pain in right hip: Secondary | ICD-10-CM | POA: Diagnosis not present

## 2019-11-28 DIAGNOSIS — I10 Essential (primary) hypertension: Secondary | ICD-10-CM | POA: Diagnosis not present

## 2019-11-28 DIAGNOSIS — M50221 Other cervical disc displacement at C4-C5 level: Secondary | ICD-10-CM | POA: Diagnosis not present

## 2019-11-28 DIAGNOSIS — M25551 Pain in right hip: Secondary | ICD-10-CM | POA: Diagnosis not present

## 2019-11-28 DIAGNOSIS — M47816 Spondylosis without myelopathy or radiculopathy, lumbar region: Secondary | ICD-10-CM | POA: Diagnosis not present

## 2019-11-28 DIAGNOSIS — M533 Sacrococcygeal disorders, not elsewhere classified: Secondary | ICD-10-CM | POA: Diagnosis not present

## 2019-11-28 DIAGNOSIS — M160 Bilateral primary osteoarthritis of hip: Secondary | ICD-10-CM | POA: Diagnosis not present

## 2019-11-28 DIAGNOSIS — M542 Cervicalgia: Secondary | ICD-10-CM | POA: Diagnosis not present

## 2019-11-28 DIAGNOSIS — M5412 Radiculopathy, cervical region: Secondary | ICD-10-CM | POA: Diagnosis not present

## 2019-11-30 DIAGNOSIS — M25551 Pain in right hip: Secondary | ICD-10-CM | POA: Diagnosis not present

## 2019-12-04 DIAGNOSIS — M25551 Pain in right hip: Secondary | ICD-10-CM | POA: Diagnosis not present

## 2019-12-04 DIAGNOSIS — M50221 Other cervical disc displacement at C4-C5 level: Secondary | ICD-10-CM | POA: Diagnosis not present

## 2019-12-06 DIAGNOSIS — M25551 Pain in right hip: Secondary | ICD-10-CM | POA: Diagnosis not present

## 2019-12-11 DIAGNOSIS — M25551 Pain in right hip: Secondary | ICD-10-CM | POA: Diagnosis not present

## 2019-12-20 DIAGNOSIS — M25551 Pain in right hip: Secondary | ICD-10-CM | POA: Diagnosis not present

## 2019-12-22 DIAGNOSIS — M25551 Pain in right hip: Secondary | ICD-10-CM | POA: Diagnosis not present

## 2019-12-25 DIAGNOSIS — M25551 Pain in right hip: Secondary | ICD-10-CM | POA: Diagnosis not present

## 2020-01-01 DIAGNOSIS — M25551 Pain in right hip: Secondary | ICD-10-CM | POA: Diagnosis not present

## 2020-01-10 DIAGNOSIS — M25551 Pain in right hip: Secondary | ICD-10-CM | POA: Diagnosis not present

## 2020-01-12 DIAGNOSIS — M25551 Pain in right hip: Secondary | ICD-10-CM | POA: Diagnosis not present

## 2020-01-15 DIAGNOSIS — M25551 Pain in right hip: Secondary | ICD-10-CM | POA: Diagnosis not present

## 2020-01-24 ENCOUNTER — Other Ambulatory Visit: Payer: Self-pay

## 2020-01-24 ENCOUNTER — Encounter: Payer: Self-pay | Admitting: Physician Assistant

## 2020-01-24 ENCOUNTER — Ambulatory Visit: Payer: BC Managed Care – PPO | Admitting: Physician Assistant

## 2020-01-24 DIAGNOSIS — L570 Actinic keratosis: Secondary | ICD-10-CM

## 2020-01-24 DIAGNOSIS — L219 Seborrheic dermatitis, unspecified: Secondary | ICD-10-CM

## 2020-01-24 DIAGNOSIS — S30860A Insect bite (nonvenomous) of lower back and pelvis, initial encounter: Secondary | ICD-10-CM

## 2020-01-24 DIAGNOSIS — M25551 Pain in right hip: Secondary | ICD-10-CM | POA: Diagnosis not present

## 2020-01-24 DIAGNOSIS — W57XXXA Bitten or stung by nonvenomous insect and other nonvenomous arthropods, initial encounter: Secondary | ICD-10-CM | POA: Diagnosis not present

## 2020-01-24 MED ORDER — BETAMETHASONE DIPROPIONATE 0.05 % EX LOTN: TOPICAL_LOTION | Freq: Two times a day (BID) | CUTANEOUS | 3 refills | Status: DC

## 2020-01-24 NOTE — Progress Notes (Signed)
   Follow up Visit  Subjective  [name] is a 54 y.o. male who presents for the following: Follow-up (Check tick bite on right side. Patient has not had a chance to do 5FU treatment on scalp yet. But patients scalp is very tender . ).  He was not able to do the 5FU due to being busy with his father having a heart issue. His head is scaling and tender. His ears and nose are clear.   Objective  Well appearing patient in no apparent distress; mood and affect are within normal limits.  All skin waist up examined. No suspicious moles noted on back.   Objective  Mid Frontal Scalp: Erythematous patches with gritty scale.  Objective  Left Upper Cutaneous Lip, Mid Lower Cutaneous Lip, Mid Parietal Scalp: Face fairly clear today. Scalp shows erythema and mild scale.  Objective  Right Lower Back: Small red spot right mid back.  Assessment & Plan  AK (actinic keratosis) Mid Frontal Scalp  These are not bad at this time and so he will plan to do the 5FU in the fall.   Seborrheic dermatitis (3) Mid Parietal Scalp; Left Upper Cutaneous Lip; Mid Lower Cutaneous Lip  Continue xologel 4 times a week and ketoconazole twice a week.   Ordered Medications: betamethasone dipropionate 0.05 % lotion  Tick bite, initial encounter Right Lower Back  Watch for red ring.

## 2020-02-02 DIAGNOSIS — M25551 Pain in right hip: Secondary | ICD-10-CM | POA: Diagnosis not present

## 2020-02-09 DIAGNOSIS — M25551 Pain in right hip: Secondary | ICD-10-CM | POA: Diagnosis not present

## 2020-02-13 DIAGNOSIS — M25551 Pain in right hip: Secondary | ICD-10-CM | POA: Diagnosis not present

## 2020-02-16 DIAGNOSIS — M545 Low back pain: Secondary | ICD-10-CM | POA: Diagnosis not present

## 2020-02-16 DIAGNOSIS — M25551 Pain in right hip: Secondary | ICD-10-CM | POA: Diagnosis not present

## 2020-02-22 DIAGNOSIS — M160 Bilateral primary osteoarthritis of hip: Secondary | ICD-10-CM | POA: Diagnosis not present

## 2020-02-22 DIAGNOSIS — M47816 Spondylosis without myelopathy or radiculopathy, lumbar region: Secondary | ICD-10-CM | POA: Diagnosis not present

## 2020-02-22 DIAGNOSIS — M542 Cervicalgia: Secondary | ICD-10-CM | POA: Diagnosis not present

## 2020-02-26 DIAGNOSIS — M542 Cervicalgia: Secondary | ICD-10-CM | POA: Diagnosis not present

## 2020-02-26 DIAGNOSIS — M160 Bilateral primary osteoarthritis of hip: Secondary | ICD-10-CM | POA: Diagnosis not present

## 2020-02-26 DIAGNOSIS — M5412 Radiculopathy, cervical region: Secondary | ICD-10-CM | POA: Diagnosis not present

## 2020-02-26 DIAGNOSIS — M47816 Spondylosis without myelopathy or radiculopathy, lumbar region: Secondary | ICD-10-CM | POA: Diagnosis not present

## 2020-02-27 DIAGNOSIS — Z0000 Encounter for general adult medical examination without abnormal findings: Secondary | ICD-10-CM | POA: Diagnosis not present

## 2020-02-27 DIAGNOSIS — Z125 Encounter for screening for malignant neoplasm of prostate: Secondary | ICD-10-CM | POA: Diagnosis not present

## 2020-02-27 DIAGNOSIS — I10 Essential (primary) hypertension: Secondary | ICD-10-CM | POA: Diagnosis not present

## 2020-02-27 DIAGNOSIS — E785 Hyperlipidemia, unspecified: Secondary | ICD-10-CM | POA: Diagnosis not present

## 2020-02-27 DIAGNOSIS — E119 Type 2 diabetes mellitus without complications: Secondary | ICD-10-CM | POA: Diagnosis not present

## 2020-02-27 DIAGNOSIS — N529 Male erectile dysfunction, unspecified: Secondary | ICD-10-CM | POA: Diagnosis not present

## 2020-02-28 DIAGNOSIS — M545 Low back pain: Secondary | ICD-10-CM | POA: Diagnosis not present

## 2020-02-28 DIAGNOSIS — M25551 Pain in right hip: Secondary | ICD-10-CM | POA: Diagnosis not present

## 2020-03-01 DIAGNOSIS — M25551 Pain in right hip: Secondary | ICD-10-CM | POA: Diagnosis not present

## 2020-03-01 DIAGNOSIS — M545 Low back pain: Secondary | ICD-10-CM | POA: Diagnosis not present

## 2020-03-06 ENCOUNTER — Other Ambulatory Visit: Payer: Self-pay

## 2020-03-06 ENCOUNTER — Encounter: Payer: Self-pay | Admitting: Physician Assistant

## 2020-03-06 ENCOUNTER — Ambulatory Visit (INDEPENDENT_AMBULATORY_CARE_PROVIDER_SITE_OTHER): Payer: BC Managed Care – PPO | Admitting: Physician Assistant

## 2020-03-06 DIAGNOSIS — L570 Actinic keratosis: Secondary | ICD-10-CM | POA: Diagnosis not present

## 2020-03-06 DIAGNOSIS — L219 Seborrheic dermatitis, unspecified: Secondary | ICD-10-CM

## 2020-03-06 NOTE — Progress Notes (Signed)
   Follow up Visit  Subjective  [name] is a 53 y.o. male who presents for the following: Seborrheic Dermatitis. At physical dr felt he had scabs on the top of his head. He also has a grey spot on the right forehead. Scalp overall can be sore to the touch. Am[place] Dura is working to keep face under control and only needs it between the brows.   Objective  Well appearing patient in no apparent distress; mood and affect are within normal limits.  A focused examination was performed including face, back, scalp and ears.. Relevant physical exam findings are noted in the Assessment and Plan. No suspicious moles noted on back.   Objective  Mid Parietal Scalp: Erythematous patches with gritty scale.  Objective  Mid Root of Nose: Erythematous plaques with greasy scale. His Seb derm appears clear today with the use of the xolegel.  Assessment & Plan  AK (actinic keratosis) Mid Parietal Scalp  He has multiple mild Aks on the vertex of the scalp, nose and frontal scalp. We discussed ln2 to the worst lesion today and then consider PDT or 5FU for the fall. He has a cruise in Sept so will start the cream after he gets back. He will use it nightly for 1 week and then take 2-3 weeks off and do it again for another week. I will follow up with him after that in early November.  Destruction of lesion - Mid Parietal Scalp Complexity: simple   Destruction method: cryotherapy   Informed consent: discussed and consent obtained   Timeout:  patient name, date of birth, surgical site, and procedure verified Lesion destroyed using liquid nitrogen: Yes   Outcome: patient tolerated procedure well with no complications    Seborrheic dermatitis Mid Root of Nose

## 2020-03-07 DIAGNOSIS — E119 Type 2 diabetes mellitus without complications: Secondary | ICD-10-CM | POA: Diagnosis not present

## 2020-03-07 DIAGNOSIS — I10 Essential (primary) hypertension: Secondary | ICD-10-CM | POA: Diagnosis not present

## 2020-03-07 DIAGNOSIS — E1165 Type 2 diabetes mellitus with hyperglycemia: Secondary | ICD-10-CM | POA: Diagnosis not present

## 2020-03-07 DIAGNOSIS — R809 Proteinuria, unspecified: Secondary | ICD-10-CM | POA: Diagnosis not present

## 2020-03-12 DIAGNOSIS — M25551 Pain in right hip: Secondary | ICD-10-CM | POA: Diagnosis not present

## 2020-03-12 DIAGNOSIS — M545 Low back pain: Secondary | ICD-10-CM | POA: Diagnosis not present

## 2020-03-19 DIAGNOSIS — N5201 Erectile dysfunction due to arterial insufficiency: Secondary | ICD-10-CM | POA: Diagnosis not present

## 2020-03-19 DIAGNOSIS — N200 Calculus of kidney: Secondary | ICD-10-CM | POA: Diagnosis not present

## 2020-03-19 DIAGNOSIS — N401 Enlarged prostate with lower urinary tract symptoms: Secondary | ICD-10-CM | POA: Diagnosis not present

## 2020-03-19 DIAGNOSIS — R3915 Urgency of urination: Secondary | ICD-10-CM | POA: Diagnosis not present

## 2020-03-19 DIAGNOSIS — R3916 Straining to void: Secondary | ICD-10-CM | POA: Diagnosis not present

## 2020-04-08 DIAGNOSIS — Z885 Allergy status to narcotic agent status: Secondary | ICD-10-CM | POA: Diagnosis not present

## 2020-04-08 DIAGNOSIS — M160 Bilateral primary osteoarthritis of hip: Secondary | ICD-10-CM | POA: Diagnosis not present

## 2020-04-08 DIAGNOSIS — M25551 Pain in right hip: Secondary | ICD-10-CM | POA: Diagnosis not present

## 2020-04-08 DIAGNOSIS — M25552 Pain in left hip: Secondary | ICD-10-CM | POA: Diagnosis not present

## 2020-04-08 DIAGNOSIS — Z888 Allergy status to other drugs, medicaments and biological substances status: Secondary | ICD-10-CM | POA: Diagnosis not present

## 2020-04-08 DIAGNOSIS — Z886 Allergy status to analgesic agent status: Secondary | ICD-10-CM | POA: Diagnosis not present

## 2020-04-22 DIAGNOSIS — M79671 Pain in right foot: Secondary | ICD-10-CM | POA: Diagnosis not present

## 2020-04-29 DIAGNOSIS — E119 Type 2 diabetes mellitus without complications: Secondary | ICD-10-CM | POA: Diagnosis not present

## 2020-04-29 DIAGNOSIS — R809 Proteinuria, unspecified: Secondary | ICD-10-CM | POA: Diagnosis not present

## 2020-04-29 DIAGNOSIS — I10 Essential (primary) hypertension: Secondary | ICD-10-CM | POA: Diagnosis not present

## 2020-04-29 DIAGNOSIS — E1165 Type 2 diabetes mellitus with hyperglycemia: Secondary | ICD-10-CM | POA: Diagnosis not present

## 2020-05-20 DIAGNOSIS — M25551 Pain in right hip: Secondary | ICD-10-CM | POA: Diagnosis not present

## 2020-05-20 DIAGNOSIS — M79671 Pain in right foot: Secondary | ICD-10-CM | POA: Diagnosis not present

## 2020-05-21 DIAGNOSIS — M5412 Radiculopathy, cervical region: Secondary | ICD-10-CM | POA: Diagnosis not present

## 2020-05-21 DIAGNOSIS — M542 Cervicalgia: Secondary | ICD-10-CM | POA: Diagnosis not present

## 2020-05-22 DIAGNOSIS — Z23 Encounter for immunization: Secondary | ICD-10-CM | POA: Diagnosis not present

## 2020-05-25 DIAGNOSIS — M79671 Pain in right foot: Secondary | ICD-10-CM | POA: Diagnosis not present

## 2020-06-01 ENCOUNTER — Other Ambulatory Visit: Payer: Self-pay | Admitting: Physician Assistant

## 2020-06-03 DIAGNOSIS — M79671 Pain in right foot: Secondary | ICD-10-CM | POA: Diagnosis not present

## 2020-06-04 ENCOUNTER — Ambulatory Visit: Payer: BC Managed Care – PPO

## 2020-06-10 DIAGNOSIS — I10 Essential (primary) hypertension: Secondary | ICD-10-CM | POA: Diagnosis not present

## 2020-06-10 DIAGNOSIS — R809 Proteinuria, unspecified: Secondary | ICD-10-CM | POA: Diagnosis not present

## 2020-06-10 DIAGNOSIS — E1165 Type 2 diabetes mellitus with hyperglycemia: Secondary | ICD-10-CM | POA: Diagnosis not present

## 2020-06-10 DIAGNOSIS — Z83511 Family history of glaucoma: Secondary | ICD-10-CM | POA: Diagnosis not present

## 2020-06-10 DIAGNOSIS — D3142 Benign neoplasm of left ciliary body: Secondary | ICD-10-CM | POA: Diagnosis not present

## 2020-06-10 DIAGNOSIS — H35033 Hypertensive retinopathy, bilateral: Secondary | ICD-10-CM | POA: Diagnosis not present

## 2020-06-10 DIAGNOSIS — E119 Type 2 diabetes mellitus without complications: Secondary | ICD-10-CM | POA: Diagnosis not present

## 2020-06-11 ENCOUNTER — Ambulatory Visit: Payer: BC Managed Care – PPO | Attending: Critical Care Medicine

## 2020-06-11 DIAGNOSIS — K760 Fatty (change of) liver, not elsewhere classified: Secondary | ICD-10-CM | POA: Diagnosis not present

## 2020-06-11 DIAGNOSIS — I10 Essential (primary) hypertension: Secondary | ICD-10-CM | POA: Diagnosis not present

## 2020-06-11 DIAGNOSIS — E785 Hyperlipidemia, unspecified: Secondary | ICD-10-CM | POA: Diagnosis not present

## 2020-06-11 DIAGNOSIS — E119 Type 2 diabetes mellitus without complications: Secondary | ICD-10-CM | POA: Diagnosis not present

## 2020-06-11 DIAGNOSIS — Z23 Encounter for immunization: Secondary | ICD-10-CM

## 2020-06-11 NOTE — Progress Notes (Signed)
   Covid-19 Vaccination Clinic  Name:  Gordon Allen    MRN: [id number] DOB: 07/24/1966  06/11/2020  [name] was observed post Covid-19 immunization for 15 minutes without incident. He was provided with Vaccine Information Sheet and instruction to access the V-Safe system.   [name] was instructed to call 911 with any severe reactions post vaccine: [name] Difficulty breathing  . Swelling of face and throat  . A fast heartbeat  . A bad rash all over body  . Dizziness and weakness

## 2020-07-15 ENCOUNTER — Ambulatory Visit: Payer: BC Managed Care – PPO | Admitting: Physician Assistant

## 2020-07-29 DIAGNOSIS — M5412 Radiculopathy, cervical region: Secondary | ICD-10-CM | POA: Diagnosis not present

## 2020-08-07 ENCOUNTER — Ambulatory Visit: Payer: BC Managed Care – PPO | Admitting: Dermatology

## 2020-08-14 DIAGNOSIS — M160 Bilateral primary osteoarthritis of hip: Secondary | ICD-10-CM | POA: Diagnosis not present

## 2020-08-14 DIAGNOSIS — M542 Cervicalgia: Secondary | ICD-10-CM | POA: Diagnosis not present

## 2020-08-14 DIAGNOSIS — Z79899 Other long term (current) drug therapy: Secondary | ICD-10-CM | POA: Diagnosis not present

## 2020-08-14 DIAGNOSIS — M5412 Radiculopathy, cervical region: Secondary | ICD-10-CM | POA: Diagnosis not present

## 2020-08-20 ENCOUNTER — Encounter: Payer: Self-pay | Admitting: Neurology

## 2020-08-20 ENCOUNTER — Ambulatory Visit (INDEPENDENT_AMBULATORY_CARE_PROVIDER_SITE_OTHER): Payer: BC Managed Care – PPO | Admitting: Neurology

## 2020-08-20 DIAGNOSIS — R202 Paresthesia of skin: Secondary | ICD-10-CM | POA: Diagnosis not present

## 2020-08-20 DIAGNOSIS — G5603 Carpal tunnel syndrome, bilateral upper limbs: Secondary | ICD-10-CM

## 2020-08-20 HISTORY — DX: Carpal tunnel syndrome, bilateral upper limbs: G56.03

## 2020-08-20 NOTE — Procedures (Signed)
     HISTORY:  Gordon Allen is a 54 year old gentleman with a history of cervical spine surgery that was done in February 2021.  Since the surgery he has had some tightness in the neck and intermittent tingling sensations down the left arm.  He is being evaluated for this issue.  He reports no weakness in the arms.   NERVE CONDUCTION STUDIES:  Nerve conduction studies were performed on both upper extremities.  The distal motor latencies for the median nerves were prolonged bilaterally with normal motor amplitudes for these nerves.  The distal motor latencies and motor amplitudes for the ulnar nerves were normal bilaterally.  The nerve conduction velocities for the median and ulnar nerves were normal bilaterally.  The sensory latencies for the median nerves were prolonged bilaterally and were normal for the ulnar nerves bilaterally.  The F-wave latencies for the ulnar nerves were within normal limits bilaterally.  EMG STUDIES:  EMG study was performed on the left upper extremity:  The first dorsal interosseous muscle reveals 2 to 4 K units with full recruitment. No fibrillations or positive waves were noted. The abductor pollicis brevis muscle reveals 2 to 4 K units with full recruitment. No fibrillations or positive waves were noted. The extensor indicis proprius muscle reveals 1 to 3 K units with full recruitment. No fibrillations or positive waves were noted. The pronator teres muscle reveals 2 to 3 K units with full recruitment. No fibrillations or positive waves were noted. The biceps muscle reveals 1 to 2 K units with full recruitment. No fibrillations or positive waves were noted. The triceps muscle reveals 2 to 4 K units with full recruitment. No fibrillations or positive waves were noted. The anterior deltoid muscle reveals 2 to 3 K units with full recruitment. No fibrillations or positive waves were noted. The cervical paraspinal muscles were tested at 2 levels. No abnormalities  of insertional activity were seen at either level tested. There was fair relaxation.   IMPRESSION:  Nerve conduction studies done on both upper extremities shows evidence of mild bilateral carpal tunnel syndrome.  EMG evaluation of the left upper extremity was relatively unremarkable without evidence of an overlying cervical radiculopathy.  Jill Alexanders MD 08/20/2020 4:36 PM  [facility] [address] [place], [place] [address]  Phone [phone] Fax [phone]

## 2020-08-20 NOTE — Progress Notes (Signed)
[place]    Nerve / Sites Muscle Latency Ref. Amplitude Ref. Rel Amp Segments Distance Velocity Ref. Area    ms ms mV mV %  cm m/s m/s mVms  R Median - APB     Wrist APB 4.9 ?4.4 7.1 ?4.0 100 Wrist - APB 7   34.3     Upper arm APB 9.6  6.8  96.6 Upper arm - Wrist 24 51 ?49 34.4  L Median - APB     Wrist APB 4.1 ?4.4 6.7 ?4.0 100 Wrist - APB 7   29.9     Upper arm APB 8.5  6.6  97.8 Upper arm - Wrist 23 53 ?49 29.9  R Ulnar - ADM     Wrist ADM 3.3 ?3.3 11.2 ?6.0 100 Wrist - ADM 7   42.5     B.Elbow ADM 7.9  10.8  96.7 B.Elbow - Wrist 23 49 ?49 36.5     A.Elbow ADM 9.9  10.4  96.2 A.Elbow - B.Elbow 10 49 ?49 36.7         A.Elbow - Wrist      L Ulnar - ADM     Wrist ADM 3.3 ?3.3 11.3 ?6.0 100 Wrist - ADM 7   37.9     B.Elbow ADM 7.2  10.5  93.1 B.Elbow - Wrist 20 52 ?49 36.2     A.Elbow ADM 9.2  10.5  99.7 A.Elbow - B.Elbow 10 50 ?49 34.1         A.Elbow - Wrist                 SNC    Nerve / Sites Rec. Site Peak Lat Ref.  Amp Ref. Segments Distance    ms ms V V  cm  R Median - Orthodromic (Dig II, Mid palm)     Dig II Wrist 4.5 ?3.4 10 ?10 Dig II - Wrist 13  L Median - Orthodromic (Dig II, Mid palm)     Dig II Wrist 3.7 ?3.4 12 ?10 Dig II - Wrist 13  R Ulnar - Orthodromic, (Dig V, Mid palm)     Dig V Wrist 3.1 ?3.1 9 ?5 Dig V - Wrist 11  L Ulnar - Orthodromic, (Dig V, Mid palm)     Dig V Wrist 3.0 ?3.1 7 ?5 Dig V - Wrist 16             F  Wave    Nerve F Lat Ref.   ms ms  R Ulnar - ADM 31.5 ?32.0  L Ulnar - ADM 29.7 ?32.0

## 2020-08-20 NOTE — Progress Notes (Signed)
Please refer to EMG and nerve conduction procedure note.  

## 2020-09-18 DIAGNOSIS — Z6829 Body mass index (BMI) 29.0-29.9, adult: Secondary | ICD-10-CM | POA: Diagnosis not present

## 2020-09-18 DIAGNOSIS — M542 Cervicalgia: Secondary | ICD-10-CM | POA: Diagnosis not present

## 2020-09-18 DIAGNOSIS — M5412 Radiculopathy, cervical region: Secondary | ICD-10-CM | POA: Diagnosis not present

## 2020-09-18 DIAGNOSIS — I10 Essential (primary) hypertension: Secondary | ICD-10-CM | POA: Diagnosis not present

## 2020-09-19 DIAGNOSIS — E119 Type 2 diabetes mellitus without complications: Secondary | ICD-10-CM | POA: Diagnosis not present

## 2020-09-19 DIAGNOSIS — K760 Fatty (change of) liver, not elsewhere classified: Secondary | ICD-10-CM | POA: Diagnosis not present

## 2020-09-19 DIAGNOSIS — E785 Hyperlipidemia, unspecified: Secondary | ICD-10-CM | POA: Diagnosis not present

## 2020-09-19 DIAGNOSIS — I10 Essential (primary) hypertension: Secondary | ICD-10-CM | POA: Diagnosis not present

## 2020-10-08 ENCOUNTER — Ambulatory Visit: Payer: BC Managed Care – PPO | Admitting: Dietician

## 2020-10-09 DIAGNOSIS — M545 Low back pain, unspecified: Secondary | ICD-10-CM | POA: Diagnosis not present

## 2020-10-09 DIAGNOSIS — M25551 Pain in right hip: Secondary | ICD-10-CM | POA: Diagnosis not present

## 2020-10-10 ENCOUNTER — Encounter: Payer: BC Managed Care – PPO | Attending: Family Medicine | Admitting: Dietician

## 2020-10-10 ENCOUNTER — Encounter: Payer: Self-pay | Admitting: Dietician

## 2020-10-10 ENCOUNTER — Other Ambulatory Visit: Payer: Self-pay

## 2020-10-10 DIAGNOSIS — E119 Type 2 diabetes mellitus without complications: Secondary | ICD-10-CM | POA: Diagnosis not present

## 2020-10-10 DIAGNOSIS — I10 Essential (primary) hypertension: Secondary | ICD-10-CM | POA: Diagnosis not present

## 2020-10-10 NOTE — Progress Notes (Signed)
Diabetes Self-Management Education  Visit Type: First/Initial  Appt. Start Time: 1530 Appt. End Time: 1640  10/10/2020  [name], identified by name and date of birth, is a 55 y.o. male with a diagnosis of Diabetes: Type 2.   ASSESSMENT  Pt reports being ready to make changes. Pt reports being diagnosed as diabetic 7 years ago. Pt reports history of HTN. Pt very adamant about wanting to get off of medication. 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 Pt reports that they are going to work on getting a lunch break at the office. 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 Pt reports stopping eating/fasting after 3 PM. Pt reports medication has not been kind to them in the past. Pt reports that they used to believe that they could just take a pill for their diabetes and that would take care of it. Pt reports their PCP wanted to put them on insulin after returning an A1c of 9.6, but pt sister told them not to do it. Pt reports losing 60 lbs of weight suspecting that the weight loss would help to keep them from having to be on medication, but was not taken off of mediations. Pt reports their nemesis is soda.Pt states they are not a sweets person. Pt reports 4-5 bottles of water a day. Pt states their sleep pattern is irregular. Pt reports their doctor recommending them being part of a trial for [facility], but was denied due to sleep pattern.  Pt reports hip problems, goes to PT twice a week. Pt reports needing a hip replacement but needs to get their diabetes under control before they are able to be considered for the procedure.  Height 5\' 11"  (1.803 m), weight 211 lb 1.6 oz (95.8 kg). Body mass index is 29.44 kg/m.   Diabetes Self-Management Education - 10/10/20 1609      Visit Information   Visit Type First/Initial      Initial Visit   Diabetes Type  Type 2    Are you taking your medications as prescribed? Yes    Date Diagnosed 2015      Health Coping   How would you rate your overall health? Good      Psychosocial Assessment   Patient Belief/Attitude about Diabetes Motivated to manage diabetes    Other persons present Patient    Patient Concerns Nutrition/Meal planning;Medication    Special Needs None    Preferred Learning Style No preference indicated    Learning Readiness Ready    How often do you need to have someone help you when you read instructions, pamphlets, or other written materials from your doctor or pharmacy? 1 - Never    What is the last grade level you completed in school? Bachelor's degeree      Pre-Education Assessment   Patient understands the diabetes disease and treatment process. Needs Instruction    Patient understands incorporating nutritional management into lifestyle. Needs Instruction    Patient undertands incorporating physical activity into lifestyle. Needs Instruction    Patient understands using medications safely. Needs Instruction    Patient understands monitoring blood glucose, interpreting and using results Needs Instruction    Patient understands prevention, detection, and treatment of acute complications. Needs Instruction    Patient understands prevention, detection, and treatment of chronic complications. Needs Instruction  Patient understands how to develop strategies to address psychosocial issues. Needs Instruction    Patient understands how to develop strategies to promote health/change behavior. Needs Instruction      Complications   Last HgB A1C per patient/outside source 9.6 %   02/27/2020   How often do you check your blood sugar? Patient declines    Have you had a dilated eye exam in the past 12 months? Yes    Have you had a dental exam in the past 12 months? Yes    Are you checking your feet? Yes    How many days per week are you checking your feet? 7      Dietary Intake    Breakfast Bacon and cheese biscuit, 16 oz pepsi    Snack (morning) none    Lunch Mad greek chicken kabob, fries, greek salad, lemonade    Snack (afternoon) none    Dinner ham club on white toast, bacon cheese fries, pepsi    Snack (evening) water    Beverage(s) water, soda, lemonade      Exercise   Exercise Type ADL's;Light (walking / raking leaves)   pt landscapes   How many days per week to you exercise? 3    How many minutes per day do you exercise? 30    Total minutes per week of exercise 90      Patient Education   Disease state  Factors that contribute to the development of diabetes;Definition of diabetes, type 1 and 2, and the diagnosis of diabetes    Nutrition management  Role of diet in the treatment of diabetes and the relationship between the three main macronutrients and blood glucose level;Reviewed blood glucose goals for pre and post meals and how to evaluate the patients' food intake on their blood glucose level.    Medications Reviewed patients medication for diabetes, action, purpose, timing of dose and side effects.    Monitoring Interpreting lab values - A1C, lipid, urine microalbumina.    Chronic complications Relationship between chronic complications and blood glucose control;Retinopathy and reason for yearly dilated eye exams;Nephropathy, what it is, prevention of, the use of ACE, ARB's and early detection of through urine microalbumia.;Assessed and discussed foot care and prevention of foot problems    Psychosocial adjustment Role of stress on diabetes;Worked with patient to identify barriers to care and solutions    Personal strategies to promote health Lifestyle issues that need to be addressed for better diabetes care   Improving quality and duration of sleep     Individualized Goals (developed by patient)   Nutrition Follow meal plan discussed    Medications take my medication as prescribed    Monitoring  Other (comment)   pt has meter, but does not see purpose  of testing yet     Post-Education Assessment   Patient understands the diabetes disease and treatment process. Needs Review    Patient understands incorporating nutritional management into lifestyle. Needs Review    Patient undertands incorporating physical activity into lifestyle. Needs Review    Patient understands using medications safely. Needs Review    Patient understands monitoring blood glucose, interpreting and using results Needs Review    Patient understands prevention, detection, and treatment of acute complications. Needs Review    Patient understands prevention, detection, and treatment of chronic complications. Needs Review    Patient understands how to develop strategies to address psychosocial issues. Needs Review    Patient understands how to develop strategies to promote health/change behavior. Needs  Review      Outcomes   Expected Outcomes Demonstrated limited interest in learning.  Expect minimal changes    Future DMSE 4-6 wks    Program Status Not Completed           Individualized Plan for Diabetes Self-Management Training:   Learning Objective:  Patient will have a greater understanding of diabetes self-management. Patient education plan is to attend individual and/or group sessions per assessed needs and concerns.   Plan:   Patient Instructions  Work on switching your sodas and lemonades to Zero or lite version more times than not  Work towards consistently getting 6-9 hours of sleep as many nights as possible.  Work towards eating three meals a day, about 5-6 hours apart!  Begin to recognize carbohydrates in your food choices!  Begin to build your meals using the proportions of the Balanced Plate. . First, select your starch choice(s) for the meal. . Next, select your source of protein to pair with your starch choice(s). . Finally, complete the remaining half of your meal with a variety of non-starchy vegetables.    Expected Outcomes:   Demonstrated limited interest in learning.  Expect minimal changes  Education material provided: My Plate  If problems or questions, patient to contact team via:  Phone and Email  Future DSME appointment: 4-6 wks

## 2020-10-10 NOTE — Patient Instructions (Signed)
Work on switching your sodas and lemonades to Zero or lite version more times than not  Work towards consistently getting 6-9 hours of sleep as many nights as possible.  Work towards eating three meals a day, about 5-6 hours apart!  Begin to recognize carbohydrates in your food choices!  Begin to build your meals using the proportions of the Balanced Plate. . First, select your starch choice(s) for the meal. . Next, select your source of protein to pair with your starch choice(s). . Finally, complete the remaining half of your meal with a variety of non-starchy vegetables.

## 2020-10-14 ENCOUNTER — Ambulatory Visit: Payer: BC Managed Care – PPO | Admitting: Dermatology

## 2020-10-14 ENCOUNTER — Other Ambulatory Visit: Payer: Self-pay

## 2020-10-14 ENCOUNTER — Encounter: Payer: Self-pay | Admitting: Dermatology

## 2020-10-14 DIAGNOSIS — D1800 Hemangioma unspecified site: Secondary | ICD-10-CM | POA: Diagnosis not present

## 2020-10-14 DIAGNOSIS — L570 Actinic keratosis: Secondary | ICD-10-CM | POA: Diagnosis not present

## 2020-10-14 DIAGNOSIS — M545 Low back pain, unspecified: Secondary | ICD-10-CM | POA: Diagnosis not present

## 2020-10-14 DIAGNOSIS — Z1283 Encounter for screening for malignant neoplasm of skin: Secondary | ICD-10-CM | POA: Diagnosis not present

## 2020-10-14 DIAGNOSIS — D485 Neoplasm of uncertain behavior of skin: Secondary | ICD-10-CM

## 2020-10-14 DIAGNOSIS — M25551 Pain in right hip: Secondary | ICD-10-CM | POA: Diagnosis not present

## 2020-10-14 DIAGNOSIS — D0439 Carcinoma in situ of skin of other parts of face: Secondary | ICD-10-CM

## 2020-10-14 NOTE — Patient Instructions (Signed)

## 2020-10-16 DIAGNOSIS — M25551 Pain in right hip: Secondary | ICD-10-CM | POA: Diagnosis not present

## 2020-10-16 DIAGNOSIS — M545 Low back pain, unspecified: Secondary | ICD-10-CM | POA: Diagnosis not present

## 2020-10-17 DIAGNOSIS — E1165 Type 2 diabetes mellitus with hyperglycemia: Secondary | ICD-10-CM | POA: Diagnosis not present

## 2020-10-17 DIAGNOSIS — E1121 Type 2 diabetes mellitus with diabetic nephropathy: Secondary | ICD-10-CM | POA: Diagnosis not present

## 2020-10-17 DIAGNOSIS — I10 Essential (primary) hypertension: Secondary | ICD-10-CM | POA: Diagnosis not present

## 2020-10-17 DIAGNOSIS — E119 Type 2 diabetes mellitus without complications: Secondary | ICD-10-CM | POA: Diagnosis not present

## 2020-10-22 ENCOUNTER — Telehealth: Payer: Self-pay | Admitting: Dermatology

## 2020-10-22 NOTE — Telephone Encounter (Signed)
Patient left message on office voice mail saying that he was calling for pathology results from last visit with Stuart Tafeen, MD. 

## 2020-10-23 NOTE — Telephone Encounter (Signed)
Patient left message on office voice mail that he was returning call about pathology results.  Patient states that he was in a meeting when he was called.

## 2020-10-23 NOTE — Telephone Encounter (Signed)
Path to patient he is aware 3-6 month check

## 2020-10-23 NOTE — Telephone Encounter (Signed)
Tried calling patient- unable to leave message due to mailbox being full

## 2020-10-25 ENCOUNTER — Encounter: Payer: Self-pay | Admitting: Dermatology

## 2020-10-25 NOTE — Progress Notes (Signed)
Follow-Up Visit   Subjective  [name] is a 55 y.o. male who presents for the following: Annual Exam (Scalp- few scaly spots).  New crusts scalp and right forehead Location:  Duration:  Quality:  Associated Signs/Symptoms: Modifying Factors:  Severity:  Timing: Context: Would like general skin examination  Objective  Well appearing patient in no apparent distress; mood and affect are within normal limits. Objective  Right Breast: Waist up skin examination: No atypical pigmented lesions.  Objective  Left Abdomen (side) - Upper: Multiple red smooth 2 to 3 mm raised papule  Objective  Mid Parietal Scalp: Ill-defined waxy 1.2 cm crust     Right Forehead  Objective  Right Forehead: 8 mm thick white crust with pink base  Images      All skin waist up examined.   Assessment & Plan    Encounter for screening for malignant neoplasm of skin Right Breast  Yearly skin check  Hemangioma, unspecified site Left Abdomen (side) - Upper  No intervention currently indicated  Neoplasm of uncertain behavior of skin Mid Parietal Scalp  Skin / nail biopsy Type of biopsy: tangential   Informed consent: discussed and consent obtained   Timeout: patient name, date of birth, surgical site, and procedure verified   Procedure prep:  Patient was prepped and draped in usual sterile fashion (Non sterile) Prep type:  Chlorhexidine Anesthesia: the lesion was anesthetized in a standard fashion   Anesthetic:  1% lidocaine w/ epinephrine 1-100,000 local infiltration Instrument used: flexible razor blade   Outcome: patient tolerated procedure well   Post-procedure details: wound care instructions given    Destruction of lesion Complexity: simple   Destruction method: electrodesiccation and curettage   Informed consent: discussed and consent obtained   Timeout:  patient name, date of birth, surgical site, and procedure verified Anesthesia: the lesion was  anesthetized in a standard fashion   Anesthetic:  1% lidocaine w/ epinephrine 1-100,000 local infiltration Curettage performed in three different directions: Yes   Curettage cycles:  3 Margin per side (cm):  0.1 Final wound size (cm):  0.4 Hemostasis achieved with:  aluminum chloride Outcome: patient tolerated procedure well with no complications   Post-procedure details: wound care instructions given    Specimen 2 - Surgical pathology Differential Diagnosis: bcc vs scc  Check Margins: No  Carcinoma in situ of skin of other part of face Right Forehead  Skin / nail biopsy - Right Forehead Type of biopsy: tangential   Informed consent: discussed and consent obtained   Timeout: patient name, date of birth, surgical site, and procedure verified   Procedure prep:  Patient was prepped and draped in usual sterile fashion (Non sterile) Prep type:  Chlorhexidine Anesthesia: the lesion was anesthetized in a standard fashion   Anesthetic:  1% lidocaine w/ epinephrine 1-100,000 local infiltration Instrument used: flexible razor blade   Outcome: patient tolerated procedure well   Post-procedure details: wound care instructions given    Destruction of lesion - Right Forehead Complexity: simple   Destruction method: electrodesiccation and curettage   Informed consent: discussed and consent obtained   Timeout:  patient name, date of birth, surgical site, and procedure verified Anesthesia: the lesion was anesthetized in a standard fashion   Anesthetic:  1% lidocaine w/ epinephrine 1-100,000 local infiltration Curettage performed in three different directions: Yes   Curettage cycles:  3 Lesion length (cm):  0.8 Lesion width (cm):  0.8 Margin per side (cm):  0 Final wound size (cm):  0.8 Hemostasis achieved with:  aluminum chloride and ferric subsulfate Outcome: patient tolerated procedure well with no complications   Post-procedure details: sterile dressing applied and wound care  instructions given   Dressing type: bandage and petrolatum   Additional details:  Wound inoculated with 5% Fluorouracil.    Specimen 1 - Surgical pathology Differential Diagnosis:bcc vs scc  Check Margins: No      I, Lavonna Monarch, MD, have reviewed all documentation for this visit.  The documentation on 10/25/20 for the exam, diagnosis, procedures, and orders are all accurate and complete.

## 2020-11-11 DIAGNOSIS — M5412 Radiculopathy, cervical region: Secondary | ICD-10-CM | POA: Diagnosis not present

## 2020-11-11 DIAGNOSIS — M542 Cervicalgia: Secondary | ICD-10-CM | POA: Diagnosis not present

## 2020-11-11 DIAGNOSIS — M160 Bilateral primary osteoarthritis of hip: Secondary | ICD-10-CM | POA: Diagnosis not present

## 2020-11-11 DIAGNOSIS — M533 Sacrococcygeal disorders, not elsewhere classified: Secondary | ICD-10-CM | POA: Diagnosis not present

## 2020-11-21 ENCOUNTER — Other Ambulatory Visit: Payer: Self-pay

## 2020-11-21 ENCOUNTER — Encounter: Payer: Self-pay | Admitting: Dietician

## 2020-11-21 ENCOUNTER — Encounter: Payer: BC Managed Care – PPO | Attending: Family Medicine | Admitting: Dietician

## 2020-11-21 DIAGNOSIS — E119 Type 2 diabetes mellitus without complications: Secondary | ICD-10-CM | POA: Insufficient documentation

## 2020-11-21 NOTE — Patient Instructions (Addendum)
Check your blood sugar fasting each morning and 2 hours after you begin eating a meal. Keep a log of your values and bring this information to each doctor's appointment.  Work towards eating three meals a day, about 5-6 hours apart. Whatever you do, make sure to eat consistently. Have a snack of 1 carb choice and 1 protein at times where you are unable to have a meal in this window.  Have 3 carb choices at each meal (45 g).   Keep up the great work with diet sodas.

## 2020-11-21 NOTE — Progress Notes (Signed)
Diabetes Self-Management Education  Visit Type: Follow-up  Appt. Start Time: 1530 Appt. End Time: 1600  11/21/2020  [name], identified by name and date of birth, is a 55 y.o. male with a diagnosis of Diabetes: Type 2.   ASSESSMENT  Pt reports seeing their doctor after our last meeting and telling them their plan to change their diet and improve their A1c. Pt reports switching from the regular sodas to diet sodas, and switched to unsweet tea. Pt reports keeping a case of water at home to drink. Pt reports not bringing undesirable foods and beverages in the house. Pt reports wanting to know about the progression of diabetes, the comorbidities and health risks. Pt reports having diabetes for 8 years and wants improve their quality of life.  Pt reports they eat enough usually, sometimes they will over indulge but not often. Pt reports work stress is still high. Pt states improving their sleep is not possible with their current work schedule.  Height 5\' 11"  (1.803 m), weight 212 lb 8 oz (96.4 kg). Body mass index is 29.64 kg/m.   Diabetes Self-Management Education - 11/21/20 1546      Visit Information   Visit Type Follow-up      Initial Visit   Diabetes Type Type 2    Are you taking your medications as prescribed? Yes      Psychosocial Assessment   Patient Belief/Attitude about Diabetes Motivated to manage diabetes      Complications   How often do you check your blood sugar? 0 times/day (not testing)      Dietary Intake   Breakfast Cherry Pop-Tarts, diet dr pepper    Snack (morning) none    Lunch Chipolte chicken bowl, corn, brown rice, mild salsa, cheese    Snack (afternoon) none    [employer], couple chips, diet pepsi    Snack (evening) none    Beverage(s) diet dr pepper, coke zero      Exercise   Exercise Type ADL's      Individualized Goals (developed by patient)   Nutrition Follow meal plan discussed    Monitoring  test my blood glucose as  discussed;send in my blood glucose log as discussed      Patient Self-Evaluation of Goals - Patient rates self as meeting previously set goals (% of time)   Nutrition 25 - 50%    Physical Activity < 25%    Medications 50 - 75 %    Monitoring < 25%    Problem Solving < 25%    Reducing Risk < 25%    Health Coping < 25%      Post-Education Assessment   Patient understands the diabetes disease and treatment process. Needs Review    Patient understands incorporating nutritional management into lifestyle. Needs Review    Patient undertands incorporating physical activity into lifestyle. Needs Review    Patient understands using medications safely. Needs Review    Patient understands monitoring blood glucose, interpreting and using results Needs Review    Patient understands prevention, detection, and treatment of acute complications. Needs Review    Patient understands prevention, detection, and treatment of chronic complications. Needs Review    Patient understands how to develop strategies to address psychosocial issues. Needs Review    Patient understands how to develop strategies to promote health/change behavior. Needs Review      Outcomes   Expected Outcomes Demonstrated interest in learning. Expect positive outcomes    Future DMSE 3-4 months  Program Status Not Completed      Subsequent Visit   Since your last visit have you continued or begun to take your medications as prescribed? Yes    Since your last visit have you had your blood pressure checked? Yes    Is your most recent blood pressure lower, unchanged, or higher since your last visit? Unchanged    Since your last visit have you experienced any weight changes? No change    Since your last visit, are you checking your blood glucose at least once a day? No           Individualized Plan for Diabetes Self-Management Training:   Learning Objective:  Patient will have a greater understanding of diabetes  self-management. Patient education plan is to attend individual and/or group sessions per assessed needs and concerns.   Plan:   Patient Instructions  Check your blood sugar fasting each morning and 2 hours after you begin eating a meal. Keep a log of your values and bring this information to each doctor's appointment.  Work towards eating three meals a day, about 5-6 hours apart. Whatever you do, make sure to eat consistently. Have a snack of 1 carb choice and 1 protein at times where you are unable to have a meal in this window.  Have 3 carb choices at each meal (45 g).   Keep up the great work with diet sodas.   Expected Outcomes:  Demonstrated interest in learning. Expect positive outcomes  Education material provided: ADA - How to Thrive: A Guide for Your Journey with Diabetes, Food label handouts and Meal plan card  If problems or questions, patient to contact team via:  Phone and Email  Future DSME appointment: 3-4 months

## 2020-12-11 DIAGNOSIS — M25551 Pain in right hip: Secondary | ICD-10-CM | POA: Diagnosis not present

## 2020-12-11 DIAGNOSIS — M545 Low back pain, unspecified: Secondary | ICD-10-CM | POA: Diagnosis not present

## 2020-12-16 DIAGNOSIS — E119 Type 2 diabetes mellitus without complications: Secondary | ICD-10-CM | POA: Diagnosis not present

## 2020-12-19 DIAGNOSIS — E785 Hyperlipidemia, unspecified: Secondary | ICD-10-CM | POA: Diagnosis not present

## 2020-12-19 DIAGNOSIS — M5090 Cervical disc disorder, unspecified, unspecified cervical region: Secondary | ICD-10-CM | POA: Diagnosis not present

## 2020-12-19 DIAGNOSIS — E1165 Type 2 diabetes mellitus with hyperglycemia: Secondary | ICD-10-CM | POA: Diagnosis not present

## 2020-12-19 DIAGNOSIS — R809 Proteinuria, unspecified: Secondary | ICD-10-CM | POA: Diagnosis not present

## 2021-01-21 ENCOUNTER — Ambulatory Visit: Payer: BC Managed Care – PPO | Admitting: Dermatology

## 2021-01-30 ENCOUNTER — Other Ambulatory Visit: Payer: Self-pay

## 2021-01-30 ENCOUNTER — Encounter: Payer: Self-pay | Admitting: Dietician

## 2021-01-30 ENCOUNTER — Encounter: Payer: BC Managed Care – PPO | Attending: Family Medicine | Admitting: Dietician

## 2021-01-30 DIAGNOSIS — E119 Type 2 diabetes mellitus without complications: Secondary | ICD-10-CM | POA: Diagnosis not present

## 2021-01-30 NOTE — Patient Instructions (Addendum)
Begin to exercise 1 time a week on Sunday morning. Choose to walk, swim, or lift weights. Look into hiring a Physiological scientist for assistance and accountability. Aim to hit over 20 minutes of moderate intensity to maximize fat burning.  Apply for [employer] patient assistance program to financial assistance with your diabetes medication.  Check out diabetes.org/recipes for great snack and meal ideas.  Look into [employer] Nutrition protein bars and chips for fast, balanced snacks.

## 2021-01-30 NOTE — Progress Notes (Signed)
Diabetes Self-Management Education  Visit Type: Follow-up  Appt. Start Time: 1530 Appt. End Time: 1605  01/30/2021  [name], identified by name and date of birth, is a 55 y.o. male with a diagnosis of Diabetes:  .   ASSESSMENT  Height 5\' 11"  (1.803 m), weight 206 lb 12.8 oz (93.8 kg). Body mass index is 28.84 kg/m.  Pt has lost 6 pounds since last appointment. Pt attributes this to switching to diet sodas and drinking more water. Pt has been checking blood sugars sporadically, seeing ~180 Pt reports work has been very busy recently. 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 Works every day. Pt reports seeing improvements in sleep, working to get more than 5 hours a day. Pt has stopped watching TV before bed as well. Pt reports endocrinologist prescribed Rybelsus, but pt states it is to expensive to begin taking it. Pt discontinued jardiance and started farxiga due to cost as well. Pt still taking metformin, but was changed to XR. RD provided pt with patient assistance program application for Novo Nordisk Pt A1c has improved since initial visit, but pt wants it to be lower. Pt wants to increase physical activity and learn how to eat to fuel physical activity, pt wants to hire a physical trainer. Pt currently walks a lot while doing yardwork 3 days a week. Pt does not keep any food at home due to history of excessive snacking, eats on the go almost all the time.   Diabetes Self-Management Education - 01/30/21 1500      Visit Information   Visit Type Follow-up      Complications   Postprandial Blood glucose range (mg/dL) 180-200      Dietary Intake   Breakfast Granola and fig bar    Lunch Chipolte chicken bowl, brown rice, cheese, salsa    Administrator, arts bowl, brown rice, cheese, salsa    Beverage(s) 2 bottles of water, diet soda      Exercise   Exercise Type ADL's;Light (walking / raking leaves)      Individualized Goals (developed by  patient)   Physical Activity Exercise 1-2 times per week      Patient Self-Evaluation of Goals - Patient rates self as meeting previously set goals (% of time)   Nutrition 25 - 50%    Physical Activity < 25%    Medications 25 - 50%   finanacial difficulties affording meds   Monitoring < 25%    Problem Solving < 25%    Reducing Risk 25 - 50%    Health Coping < 25%      Post-Education Assessment   Patient understands the diabetes disease and treatment process. Needs Review    Patient understands incorporating nutritional management into lifestyle. Needs Review    Patient undertands incorporating physical activity into lifestyle. Needs Review    Patient understands using medications safely. Needs Review    Patient understands monitoring blood glucose, interpreting and using results Needs Review    Patient understands prevention, detection, and treatment of acute complications. Needs Review    Patient understands prevention, detection, and treatment of chronic complications. Needs Review    Patient understands how to develop strategies to address psychosocial issues. Needs Review    Patient understands how to develop strategies to promote health/change behavior. Needs Review      Outcomes   Expected Outcomes Demonstrated limited interest in learning.  Expect minimal changes    Future DMSE 3-4 months  Program Status Not Completed           Individualized Plan for Diabetes Self-Management Training:   Learning Objective:  Patient will have a greater understanding of diabetes self-management. Patient education plan is to attend individual and/or group sessions per assessed needs and concerns.   Plan:   Patient Instructions  Begin to exercise 1 time a week on Sunday morning. Choose to walk, swim, or lift weights. Look into hiring a Physiological scientist for assistance and accountability. Aim to hit over 20 minutes of moderate intensity to maximize fat burning.  Apply for [employer] patient assistance program to financial assistance with your diabetes medication.  Check out diabetes.org/recipes for great snack and meal ideas.  Look into [employer] Nutrition protein bars and chips for fast, balanced snacks.   Expected Outcomes:  Demonstrated limited interest in learning.  Expect minimal changes  Education material provided: [employer] PAP application  If problems or questions, patient to contact team via:  Regulatory affairs officer  Future DSME appointment: 3-4 months

## 2021-02-11 DIAGNOSIS — M533 Sacrococcygeal disorders, not elsewhere classified: Secondary | ICD-10-CM | POA: Diagnosis not present

## 2021-02-11 DIAGNOSIS — M5412 Radiculopathy, cervical region: Secondary | ICD-10-CM | POA: Diagnosis not present

## 2021-02-11 DIAGNOSIS — M160 Bilateral primary osteoarthritis of hip: Secondary | ICD-10-CM | POA: Diagnosis not present

## 2021-02-11 DIAGNOSIS — M542 Cervicalgia: Secondary | ICD-10-CM | POA: Diagnosis not present

## 2021-03-04 ENCOUNTER — Other Ambulatory Visit: Payer: Self-pay

## 2021-03-04 ENCOUNTER — Ambulatory Visit: Payer: BC Managed Care – PPO | Admitting: Dermatology

## 2021-03-04 ENCOUNTER — Encounter: Payer: Self-pay | Admitting: Dermatology

## 2021-03-04 DIAGNOSIS — L570 Actinic keratosis: Secondary | ICD-10-CM

## 2021-03-04 DIAGNOSIS — R21 Rash and other nonspecific skin eruption: Secondary | ICD-10-CM | POA: Diagnosis not present

## 2021-03-04 MED ORDER — CLOBETASOL PROP EMOLLIENT BASE 0.05 % EX CREA: TOPICAL_CREAM | CUTANEOUS | 1 refills | Status: AC

## 2021-03-04 MED ORDER — TOLAK 4 % EX CREA: TOPICAL_CREAM | CUTANEOUS | 0 refills | Status: DC

## 2021-03-17 ENCOUNTER — Encounter: Payer: Self-pay | Admitting: Dermatology

## 2021-03-17 NOTE — Progress Notes (Signed)
   Follow-Up Visit   Subjective  [name] is a 55 y.o. male who presents for the following: Follow-up (Scalp bumps tx selson blue otc, poison ivey or oak bumps itch ).  Crust on scalp plus recent rash especially on legs Location:  Duration:  Quality:  Associated Signs/Symptoms: Modifying Factors:  Severity:  Timing: Context:   Objective  Well appearing patient in no apparent distress; mood and affect are within normal limits. Left Lower Leg - Anterior, Right Lower Leg - Anterior Lower legs after yard work bumps with itch: Although not linear, clinically better fits contact dermatitis than allergy to bites.  Scalp (5) Lyndel has both focally thick crusts plus diffuse UV damage of scalp.  Although there is a history of seborrheic dermatitis, most of the current changes represent actinic keratoses.  Tolak topical treatment in the fall    All skin waist up examined.  Less legs.   Assessment & Plan    Rash and other nonspecific skin eruption Left Lower Leg - Anterior; Right Lower Leg - Anterior  Topical clobetasol daily after bathing for 2 weeks; follow-up by telephone at that time.  Related Medications Clobetasol Prop Emollient Base (CLOBETASOL PROPIONATE E) 0.05 % emollient cream Apply to rash once daily not for face or skin folds  AK (actinic keratosis) (5) Scalp  LN2 freeze to the thick hypertrophic lesions. Tolak in the fall with a goal of 20 total applications.  Warned of significant temporary irritation.  He knows he can call me with any questions or problems.  Destruction of lesion - Scalp Complexity: simple   Destruction method: cryotherapy   Informed consent: discussed and consent obtained   Timeout:  patient name, date of birth, surgical site, and procedure verified Lesion destroyed using liquid nitrogen: Yes   Cryotherapy cycles:  4 Outcome: patient tolerated procedure well with no complications   Post-procedure details: wound care instructions  given    Fluorouracil (TOLAK) 4 % CREA - Scalp Apply to scalp nightly total 28 applications      I, Lavonna Monarch, MD, have reviewed all documentation for this visit.  The documentation on 03/17/21 for the exam, diagnosis, procedures, and orders are all accurate and complete.

## 2021-04-21 ENCOUNTER — Encounter: Payer: Self-pay | Admitting: Dermatology

## 2021-04-21 ENCOUNTER — Other Ambulatory Visit: Payer: Self-pay

## 2021-04-21 ENCOUNTER — Ambulatory Visit: Payer: BC Managed Care – PPO | Admitting: Dermatology

## 2021-04-21 DIAGNOSIS — D1801 Hemangioma of skin and subcutaneous tissue: Secondary | ICD-10-CM

## 2021-04-21 DIAGNOSIS — L570 Actinic keratosis: Secondary | ICD-10-CM

## 2021-04-21 NOTE — Patient Instructions (Signed)
Please review TOLAK treatment photos but all cases are different

## 2021-05-02 ENCOUNTER — Encounter: Payer: Self-pay | Admitting: Dermatology

## 2021-05-03 NOTE — Progress Notes (Signed)
   Follow-Up Visit   Subjective  [name] is a 55 y.o. male who presents for the following: Follow-up (Several spots on scalp- ln2 last office visit- no concerns).  Residual crusts on scalp Location:  Duration:  Quality:  Associated Signs/Symptoms: Modifying Factors:  Severity:  Timing: Context:   Objective  Well appearing patient in no apparent distress; mood and affect are within normal limits. Left Abdomen (side) - Lower Multiple 1 mm smooth red dermal papules  Scalp Multiple small gritty pink crust; patient has tolak from previous visit for AK's    A focused examination was performed including head and neck. Relevant physical exam findings are noted in the Assessment and Plan.   Assessment & Plan    Hemangioma of skin Left Abdomen (side) - Lower  No intervention necessary, keep 1 year follow up  AK (actinic keratosis) Scalp  Ok to treat winter /fall scalp, ears, forehead total 28 nightly applications. Patient may follow up by mychart after treatment      I, Lavonna Monarch, MD, have reviewed all documentation for this visit.  The documentation on 05/03/21 for the exam, diagnosis, procedures, and orders are all accurate and complete.

## 2021-05-09 ENCOUNTER — Ambulatory Visit (INDEPENDENT_AMBULATORY_CARE_PROVIDER_SITE_OTHER): Payer: BC Managed Care – PPO

## 2021-05-09 ENCOUNTER — Other Ambulatory Visit: Payer: Self-pay

## 2021-05-09 DIAGNOSIS — Z23 Encounter for immunization: Secondary | ICD-10-CM

## 2021-05-09 NOTE — Progress Notes (Signed)
    [facility]  Name:  Gordon Allen    MRN: [id number] DOB: [birth year]   05/09/2021  [name] was observed post JYNNEOS immunization for 15 minutes without incident. He was provided with Vaccine Information Sheet and instruction to access the V-Safe system.   [name] was instructed to call 911 with any severe reactions post vaccine: Difficulty breathing  Swelling of face and throat  A fast heartbeat  A bad rash all over body  Dizziness and weakness     Dayshawn Irizarry T Brooks Sailors

## 2021-05-13 ENCOUNTER — Ambulatory Visit: Payer: BC Managed Care – PPO | Admitting: Dietician

## 2021-06-16 DIAGNOSIS — E119 Type 2 diabetes mellitus without complications: Secondary | ICD-10-CM | POA: Diagnosis not present

## 2021-06-16 DIAGNOSIS — Z83511 Family history of glaucoma: Secondary | ICD-10-CM | POA: Diagnosis not present

## 2021-06-16 DIAGNOSIS — H35033 Hypertensive retinopathy, bilateral: Secondary | ICD-10-CM | POA: Diagnosis not present

## 2021-06-20 ENCOUNTER — Ambulatory Visit: Payer: BC Managed Care – PPO

## 2021-07-01 DIAGNOSIS — M542 Cervicalgia: Secondary | ICD-10-CM | POA: Diagnosis not present

## 2021-07-01 DIAGNOSIS — M545 Low back pain, unspecified: Secondary | ICD-10-CM | POA: Diagnosis not present

## 2021-07-01 DIAGNOSIS — M160 Bilateral primary osteoarthritis of hip: Secondary | ICD-10-CM | POA: Diagnosis not present

## 2021-07-01 DIAGNOSIS — M25551 Pain in right hip: Secondary | ICD-10-CM | POA: Diagnosis not present

## 2021-07-01 DIAGNOSIS — M533 Sacrococcygeal disorders, not elsewhere classified: Secondary | ICD-10-CM | POA: Diagnosis not present

## 2021-07-07 ENCOUNTER — Emergency Department (HOSPITAL_BASED_OUTPATIENT_CLINIC_OR_DEPARTMENT_OTHER): Payer: BC Managed Care – PPO

## 2021-07-07 ENCOUNTER — Emergency Department (HOSPITAL_BASED_OUTPATIENT_CLINIC_OR_DEPARTMENT_OTHER)
Admission: EM | Admit: 2021-07-07 | Discharge: 2021-07-07 | Disposition: A | Discharge status: Home / Self Care | Payer: BC Managed Care – PPO | Attending: Emergency Medicine | Admitting: Emergency Medicine

## 2021-07-07 ENCOUNTER — Encounter (HOSPITAL_BASED_OUTPATIENT_CLINIC_OR_DEPARTMENT_OTHER): Payer: Self-pay | Admitting: *Deleted

## 2021-07-07 ENCOUNTER — Other Ambulatory Visit: Payer: Self-pay

## 2021-07-07 DIAGNOSIS — S199XXA Unspecified injury of neck, initial encounter: Secondary | ICD-10-CM | POA: Insufficient documentation

## 2021-07-07 DIAGNOSIS — Z79899 Other long term (current) drug therapy: Secondary | ICD-10-CM | POA: Diagnosis not present

## 2021-07-07 DIAGNOSIS — M542 Cervicalgia: Secondary | ICD-10-CM | POA: Diagnosis not present

## 2021-07-07 DIAGNOSIS — R519 Headache, unspecified: Secondary | ICD-10-CM | POA: Insufficient documentation

## 2021-07-07 DIAGNOSIS — M47812 Spondylosis without myelopathy or radiculopathy, cervical region: Secondary | ICD-10-CM | POA: Diagnosis not present

## 2021-07-07 DIAGNOSIS — S4992XA Unspecified injury of left shoulder and upper arm, initial encounter: Secondary | ICD-10-CM | POA: Diagnosis not present

## 2021-07-07 DIAGNOSIS — Y92410 Unspecified street and highway as the place of occurrence of the external cause: Secondary | ICD-10-CM | POA: Diagnosis not present

## 2021-07-07 DIAGNOSIS — I10 Essential (primary) hypertension: Secondary | ICD-10-CM | POA: Insufficient documentation

## 2021-07-07 DIAGNOSIS — J320 Chronic maxillary sinusitis: Secondary | ICD-10-CM | POA: Diagnosis not present

## 2021-07-07 DIAGNOSIS — Z7984 Long term (current) use of oral hypoglycemic drugs: Secondary | ICD-10-CM | POA: Insufficient documentation

## 2021-07-07 DIAGNOSIS — M25512 Pain in left shoulder: Secondary | ICD-10-CM | POA: Diagnosis not present

## 2021-07-07 DIAGNOSIS — E119 Type 2 diabetes mellitus without complications: Secondary | ICD-10-CM | POA: Insufficient documentation

## 2021-07-07 DIAGNOSIS — S3992XA Unspecified injury of lower back, initial encounter: Secondary | ICD-10-CM | POA: Diagnosis not present

## 2021-07-07 DIAGNOSIS — Z041 Encounter for examination and observation following transport accident: Secondary | ICD-10-CM | POA: Diagnosis not present

## 2021-07-07 MED ORDER — METHOCARBAMOL 500 MG PO TABS: 500.0000 mg | ORAL_TABLET | Freq: Two times a day (BID) | ORAL | 0 refills | Status: DC
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
## 2021-07-07 NOTE — Discharge Instructions (Signed)
Please use Tylenol or ibuprofen for pain.  You may use 600 mg ibuprofen every 6 hours or 1000 mg of Tylenol every 6 hours.  You may choose to alternate between the 2.  This would be most effective.  Not to exceed 4 g of Tylenol within 24 hours.  Not to exceed 3200 mg ibuprofen 24 hours.  You can use robaxin in addition to the above as needed up to twice daily. It may make you sleepy, take care before piloting a vehicle or operating heavy machinery.
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
## 2021-07-08 DIAGNOSIS — M25551 Pain in right hip: Secondary | ICD-10-CM | POA: Diagnosis not present

## 2021-07-08 DIAGNOSIS — M542 Cervicalgia: Secondary | ICD-10-CM | POA: Diagnosis not present

## 2021-07-08 DIAGNOSIS — M545 Low back pain, unspecified: Secondary | ICD-10-CM | POA: Diagnosis not present

## 2021-07-15 ENCOUNTER — Telehealth: Payer: Self-pay

## 2021-07-15 DIAGNOSIS — M545 Low back pain, unspecified: Secondary | ICD-10-CM | POA: Diagnosis not present

## 2021-07-15 DIAGNOSIS — M25551 Pain in right hip: Secondary | ICD-10-CM | POA: Diagnosis not present

## 2021-07-15 DIAGNOSIS — M542 Cervicalgia: Secondary | ICD-10-CM | POA: Diagnosis not present

## 2021-07-15 NOTE — Telephone Encounter (Signed)
Attempted to contact patient to offer 2nd mpx vaccine as our office has limited supply available. The mailbox is full and can not accept message at this time. Gordon Allen

## 2021-07-16 DIAGNOSIS — M79622 Pain in left upper arm: Secondary | ICD-10-CM | POA: Diagnosis not present

## 2021-07-16 DIAGNOSIS — E1165 Type 2 diabetes mellitus with hyperglycemia: Secondary | ICD-10-CM | POA: Diagnosis not present

## 2021-07-17 DIAGNOSIS — M7542 Impingement syndrome of left shoulder: Secondary | ICD-10-CM | POA: Diagnosis not present

## 2021-07-22 DIAGNOSIS — M25512 Pain in left shoulder: Secondary | ICD-10-CM | POA: Diagnosis not present

## 2021-07-29 DIAGNOSIS — M25512 Pain in left shoulder: Secondary | ICD-10-CM | POA: Diagnosis not present

## 2021-07-31 DIAGNOSIS — M542 Cervicalgia: Secondary | ICD-10-CM | POA: Diagnosis not present

## 2021-07-31 DIAGNOSIS — M25512 Pain in left shoulder: Secondary | ICD-10-CM | POA: Diagnosis not present

## 2021-08-04 DIAGNOSIS — I10 Essential (primary) hypertension: Secondary | ICD-10-CM | POA: Diagnosis not present

## 2021-08-04 DIAGNOSIS — Z6829 Body mass index (BMI) 29.0-29.9, adult: Secondary | ICD-10-CM | POA: Diagnosis not present

## 2021-08-04 DIAGNOSIS — M542 Cervicalgia: Secondary | ICD-10-CM | POA: Diagnosis not present

## 2021-08-13 DIAGNOSIS — M542 Cervicalgia: Secondary | ICD-10-CM | POA: Diagnosis not present

## 2021-08-13 DIAGNOSIS — M25512 Pain in left shoulder: Secondary | ICD-10-CM | POA: Diagnosis not present

## 2021-08-20 DIAGNOSIS — M75122 Complete rotator cuff tear or rupture of left shoulder, not specified as traumatic: Secondary | ICD-10-CM | POA: Diagnosis not present

## 2021-09-10 DIAGNOSIS — I10 Essential (primary) hypertension: Secondary | ICD-10-CM | POA: Diagnosis not present

## 2021-09-10 DIAGNOSIS — M542 Cervicalgia: Secondary | ICD-10-CM | POA: Diagnosis not present

## 2021-09-10 DIAGNOSIS — Z6829 Body mass index (BMI) 29.0-29.9, adult: Secondary | ICD-10-CM | POA: Diagnosis not present

## 2021-10-03 ENCOUNTER — Telehealth: Payer: Self-pay

## 2021-10-03 NOTE — Telephone Encounter (Signed)
Called patient to see if he was interested in getting his second monkeypox vaccine. Voicemail box full, unable to leave a message.    Falls, CMA

## 2021-10-08 DIAGNOSIS — M75122 Complete rotator cuff tear or rupture of left shoulder, not specified as traumatic: Secondary | ICD-10-CM | POA: Diagnosis not present

## 2021-10-10 DIAGNOSIS — M25512 Pain in left shoulder: Secondary | ICD-10-CM | POA: Diagnosis not present

## 2021-10-15 DIAGNOSIS — M25512 Pain in left shoulder: Secondary | ICD-10-CM | POA: Diagnosis not present

## 2021-10-22 DIAGNOSIS — M25512 Pain in left shoulder: Secondary | ICD-10-CM | POA: Diagnosis not present

## 2021-10-27 DIAGNOSIS — M25512 Pain in left shoulder: Secondary | ICD-10-CM | POA: Diagnosis not present

## 2021-11-04 ENCOUNTER — Ambulatory Visit: Payer: BC Managed Care – PPO | Admitting: Dermatology

## 2021-11-04 ENCOUNTER — Other Ambulatory Visit: Payer: Self-pay

## 2021-11-04 DIAGNOSIS — D1801 Hemangioma of skin and subcutaneous tissue: Secondary | ICD-10-CM

## 2021-11-04 DIAGNOSIS — Z1283 Encounter for screening for malignant neoplasm of skin: Secondary | ICD-10-CM | POA: Diagnosis not present

## 2021-11-04 DIAGNOSIS — I8392 Asymptomatic varicose veins of left lower extremity: Secondary | ICD-10-CM

## 2021-11-04 DIAGNOSIS — L309 Dermatitis, unspecified: Secondary | ICD-10-CM

## 2021-11-04 NOTE — Patient Instructions (Addendum)
NO HYDROCORTISONE CREAM OR LOTION ?GET HYDROCORTISONE OINTMENT AND APPLY DAILY  ? ?KETOCONALZOLE 1% OVER THE COUNTER, 2% PRESCRIPTION  ?(NIZORAL) ? ?

## 2021-11-13 DIAGNOSIS — M75120 Complete rotator cuff tear or rupture of unspecified shoulder, not specified as traumatic: Secondary | ICD-10-CM | POA: Insufficient documentation

## 2021-11-13 DIAGNOSIS — M19012 Primary osteoarthritis, left shoulder: Secondary | ICD-10-CM | POA: Insufficient documentation

## 2021-11-15 ENCOUNTER — Encounter: Payer: Self-pay | Admitting: Dermatology

## 2021-11-15 NOTE — Progress Notes (Signed)
? ?  Follow-Up Visit ?  ?Subjective  ?Gordon Allen is a 56 y.o. male who presents for the following: Annual Exam (Pt here for annual. Pt states there is crusty spot behind the L ear. Pt here for refill on Xylogel for the face and keto shampoo for the scalp ). ? ?General skin examination, rough spot behind ear ?Location:  ?Duration:  ?Quality:  ?Associated Signs/Symptoms: ?Modifying Factors:  ?Severity:  ?Timing: ?Context:  ? ?Objective  ?Well appearing patient in no apparent distress; mood and affect are within normal limits. ?General skin examination: No atypical pigmented lesions or nonmelanoma skin cancer. ? ?Multiple smooth red 1 mm dermal papules ? ?Left Lower Leg - Posterior ?Discussed treatment options for varicose veins and ways to minimize lower extremity venous pressure. ? ?Left Postauricular Area ?Although patient wondered about a growth behind his ear the pink scale in the retroauricular crease better fits a localized eczematous process. ? ? ? ?A full examination was performed including scalp, head, eyes, ears, nose, lips, neck, chest, axillae, abdomen, back, buttocks, bilateral upper extremities, bilateral lower extremities, hands, feet, fingers, toes, fingernails, and toenails. All findings within normal limits unless otherwise noted below. ? ? ?Assessment & Plan  ? ? ?Screening exam for skin cancer ? ?Annual skin examination, encouraged to self examine twice annually.  Continue ultraviolet protection. ? ?Cherry angioma ? ?No intervention necessary ? ?Varicose veins of left lower extremity, unspecified whether complicated ?Left Lower Leg - Posterior ? ?May choose to consult phlebologist orvascular surgeon. ? ?Eczema, unspecified type ?Left Postauricular Area ? ?OTC HYROCORTISONE OINTMENT apply nightly for 2 weeks. ? ? ? ? ? ?I, Lavonna Monarch, MD, have reviewed all documentation for this visit.  The documentation on 11/15/21 for the exam, diagnosis, procedures, and orders are all accurate and  complete. ?

## 2021-12-10 DIAGNOSIS — M5412 Radiculopathy, cervical region: Secondary | ICD-10-CM | POA: Diagnosis not present

## 2021-12-10 DIAGNOSIS — E785 Hyperlipidemia, unspecified: Secondary | ICD-10-CM | POA: Diagnosis not present

## 2021-12-10 DIAGNOSIS — Z6828 Body mass index (BMI) 28.0-28.9, adult: Secondary | ICD-10-CM | POA: Diagnosis not present

## 2021-12-10 DIAGNOSIS — R809 Proteinuria, unspecified: Secondary | ICD-10-CM | POA: Diagnosis not present

## 2021-12-10 DIAGNOSIS — E1121 Type 2 diabetes mellitus with diabetic nephropathy: Secondary | ICD-10-CM | POA: Diagnosis not present

## 2021-12-10 DIAGNOSIS — E1165 Type 2 diabetes mellitus with hyperglycemia: Secondary | ICD-10-CM | POA: Diagnosis not present

## 2021-12-10 DIAGNOSIS — I10 Essential (primary) hypertension: Secondary | ICD-10-CM | POA: Diagnosis not present

## 2021-12-19 DIAGNOSIS — M75112 Incomplete rotator cuff tear or rupture of left shoulder, not specified as traumatic: Secondary | ICD-10-CM | POA: Diagnosis not present

## 2021-12-19 DIAGNOSIS — M19012 Primary osteoarthritis, left shoulder: Secondary | ICD-10-CM | POA: Diagnosis not present

## 2022-03-20 DIAGNOSIS — E1165 Type 2 diabetes mellitus with hyperglycemia: Secondary | ICD-10-CM | POA: Diagnosis not present

## 2022-03-20 DIAGNOSIS — E538 Deficiency of other specified B group vitamins: Secondary | ICD-10-CM | POA: Diagnosis not present

## 2022-03-20 DIAGNOSIS — E785 Hyperlipidemia, unspecified: Secondary | ICD-10-CM | POA: Diagnosis not present

## 2022-03-20 DIAGNOSIS — I10 Essential (primary) hypertension: Secondary | ICD-10-CM | POA: Diagnosis not present

## 2022-03-20 DIAGNOSIS — K760 Fatty (change of) liver, not elsewhere classified: Secondary | ICD-10-CM | POA: Diagnosis not present

## 2022-03-20 DIAGNOSIS — Z0000 Encounter for general adult medical examination without abnormal findings: Secondary | ICD-10-CM | POA: Diagnosis not present

## 2022-03-20 DIAGNOSIS — Z125 Encounter for screening for malignant neoplasm of prostate: Secondary | ICD-10-CM | POA: Diagnosis not present

## 2022-03-20 LAB — LAB REPORT - SCANNED: EGFR: 103

## 2022-04-01 DIAGNOSIS — E1165 Type 2 diabetes mellitus with hyperglycemia: Secondary | ICD-10-CM | POA: Diagnosis not present

## 2022-04-01 DIAGNOSIS — E1121 Type 2 diabetes mellitus with diabetic nephropathy: Secondary | ICD-10-CM | POA: Diagnosis not present

## 2022-04-01 DIAGNOSIS — E785 Hyperlipidemia, unspecified: Secondary | ICD-10-CM | POA: Diagnosis not present

## 2022-04-01 DIAGNOSIS — I10 Essential (primary) hypertension: Secondary | ICD-10-CM | POA: Diagnosis not present

## 2022-05-12 DIAGNOSIS — I10 Essential (primary) hypertension: Secondary | ICD-10-CM | POA: Diagnosis not present

## 2022-05-12 DIAGNOSIS — E785 Hyperlipidemia, unspecified: Secondary | ICD-10-CM | POA: Diagnosis not present

## 2022-05-12 DIAGNOSIS — E1121 Type 2 diabetes mellitus with diabetic nephropathy: Secondary | ICD-10-CM | POA: Diagnosis not present

## 2022-05-12 DIAGNOSIS — E1165 Type 2 diabetes mellitus with hyperglycemia: Secondary | ICD-10-CM | POA: Diagnosis not present

## 2022-05-21 DIAGNOSIS — Z23 Encounter for immunization: Secondary | ICD-10-CM | POA: Diagnosis not present

## 2022-07-15 DIAGNOSIS — H2189 Other specified disorders of iris and ciliary body: Secondary | ICD-10-CM | POA: Diagnosis not present

## 2022-07-15 DIAGNOSIS — Z83511 Family history of glaucoma: Secondary | ICD-10-CM | POA: Diagnosis not present

## 2022-07-15 DIAGNOSIS — E113291 Type 2 diabetes mellitus with mild nonproliferative diabetic retinopathy without macular edema, right eye: Secondary | ICD-10-CM | POA: Diagnosis not present

## 2022-07-15 DIAGNOSIS — H35033 Hypertensive retinopathy, bilateral: Secondary | ICD-10-CM | POA: Diagnosis not present

## 2022-08-03 DIAGNOSIS — M25551 Pain in right hip: Secondary | ICD-10-CM | POA: Diagnosis not present

## 2022-08-03 DIAGNOSIS — M545 Low back pain, unspecified: Secondary | ICD-10-CM | POA: Diagnosis not present

## 2022-08-04 DIAGNOSIS — E785 Hyperlipidemia, unspecified: Secondary | ICD-10-CM | POA: Diagnosis not present

## 2022-08-04 DIAGNOSIS — I10 Essential (primary) hypertension: Secondary | ICD-10-CM | POA: Diagnosis not present

## 2022-08-04 DIAGNOSIS — E1165 Type 2 diabetes mellitus with hyperglycemia: Secondary | ICD-10-CM | POA: Diagnosis not present

## 2022-08-04 DIAGNOSIS — Z794 Long term (current) use of insulin: Secondary | ICD-10-CM | POA: Diagnosis not present

## 2022-08-20 DIAGNOSIS — E1165 Type 2 diabetes mellitus with hyperglycemia: Secondary | ICD-10-CM | POA: Diagnosis not present

## 2022-08-20 DIAGNOSIS — Z794 Long term (current) use of insulin: Secondary | ICD-10-CM | POA: Diagnosis not present

## 2022-08-20 DIAGNOSIS — I10 Essential (primary) hypertension: Secondary | ICD-10-CM | POA: Diagnosis not present

## 2022-08-20 DIAGNOSIS — E785 Hyperlipidemia, unspecified: Secondary | ICD-10-CM | POA: Diagnosis not present

## 2022-09-23 DIAGNOSIS — E1121 Type 2 diabetes mellitus with diabetic nephropathy: Secondary | ICD-10-CM | POA: Diagnosis not present

## 2022-09-23 DIAGNOSIS — I10 Essential (primary) hypertension: Secondary | ICD-10-CM | POA: Diagnosis not present

## 2022-09-23 DIAGNOSIS — E785 Hyperlipidemia, unspecified: Secondary | ICD-10-CM | POA: Diagnosis not present

## 2022-09-23 DIAGNOSIS — E1165 Type 2 diabetes mellitus with hyperglycemia: Secondary | ICD-10-CM | POA: Diagnosis not present

## 2022-11-05 DIAGNOSIS — E1165 Type 2 diabetes mellitus with hyperglycemia: Secondary | ICD-10-CM | POA: Diagnosis not present

## 2022-11-05 DIAGNOSIS — E785 Hyperlipidemia, unspecified: Secondary | ICD-10-CM | POA: Diagnosis not present

## 2022-11-09 ENCOUNTER — Ambulatory Visit: Payer: BC Managed Care – PPO | Admitting: Dermatology

## 2022-11-12 DIAGNOSIS — E785 Hyperlipidemia, unspecified: Secondary | ICD-10-CM | POA: Diagnosis not present

## 2022-11-12 DIAGNOSIS — E1165 Type 2 diabetes mellitus with hyperglycemia: Secondary | ICD-10-CM | POA: Diagnosis not present

## 2022-11-13 DIAGNOSIS — M25551 Pain in right hip: Secondary | ICD-10-CM | POA: Diagnosis not present

## 2022-11-13 DIAGNOSIS — M545 Low back pain, unspecified: Secondary | ICD-10-CM | POA: Diagnosis not present

## 2023-01-19 DIAGNOSIS — Z1283 Encounter for screening for malignant neoplasm of skin: Secondary | ICD-10-CM | POA: Diagnosis not present

## 2023-01-19 DIAGNOSIS — L568 Other specified acute skin changes due to ultraviolet radiation: Secondary | ICD-10-CM | POA: Diagnosis not present

## 2023-01-19 DIAGNOSIS — B079 Viral wart, unspecified: Secondary | ICD-10-CM | POA: Diagnosis not present

## 2023-01-19 DIAGNOSIS — L218 Other seborrheic dermatitis: Secondary | ICD-10-CM | POA: Diagnosis not present

## 2023-01-19 DIAGNOSIS — D485 Neoplasm of uncertain behavior of skin: Secondary | ICD-10-CM | POA: Diagnosis not present

## 2023-01-28 DIAGNOSIS — E1165 Type 2 diabetes mellitus with hyperglycemia: Secondary | ICD-10-CM | POA: Diagnosis not present

## 2023-01-28 DIAGNOSIS — R809 Proteinuria, unspecified: Secondary | ICD-10-CM | POA: Diagnosis not present

## 2023-03-08 ENCOUNTER — Ambulatory Visit: Payer: BC Managed Care – PPO | Admitting: Dermatology

## 2023-03-23 DIAGNOSIS — L218 Other seborrheic dermatitis: Secondary | ICD-10-CM | POA: Diagnosis not present

## 2023-03-31 ENCOUNTER — Encounter: Payer: Self-pay | Admitting: Cardiovascular Disease

## 2023-03-31 ENCOUNTER — Ambulatory Visit: Payer: BC Managed Care – PPO | Attending: Cardiovascular Disease | Admitting: Cardiovascular Disease

## 2023-03-31 VITALS — BP 104/68 | HR 73 | Ht 71.0 in | Wt 217.4 lb

## 2023-03-31 DIAGNOSIS — R0789 Other chest pain: Secondary | ICD-10-CM

## 2023-03-31 DIAGNOSIS — E1165 Type 2 diabetes mellitus with hyperglycemia: Secondary | ICD-10-CM | POA: Diagnosis not present

## 2023-03-31 DIAGNOSIS — E785 Hyperlipidemia, unspecified: Secondary | ICD-10-CM | POA: Diagnosis not present

## 2023-03-31 DIAGNOSIS — Z136 Encounter for screening for cardiovascular disorders: Secondary | ICD-10-CM

## 2023-03-31 DIAGNOSIS — E782 Mixed hyperlipidemia: Secondary | ICD-10-CM

## 2023-03-31 DIAGNOSIS — I10 Essential (primary) hypertension: Secondary | ICD-10-CM

## 2023-03-31 DIAGNOSIS — E538 Deficiency of other specified B group vitamins: Secondary | ICD-10-CM | POA: Diagnosis not present

## 2023-03-31 DIAGNOSIS — Z0000 Encounter for general adult medical examination without abnormal findings: Secondary | ICD-10-CM | POA: Diagnosis not present

## 2023-03-31 NOTE — Assessment & Plan Note (Signed)
Patient had an episode of atypical chest pain 6 to 8 weeks ago lasting for about 48 hours.  He thinks it was related to physical activity and was relieved with Aleve.  I am going to get a coronary calcium score to her stratify.

## 2023-03-31 NOTE — Assessment & Plan Note (Signed)
History of hyperlipidemia on statin therapy with lipid profile performed 11/05/2022 revealing total cholesterol of 172, LDL 108 and HDL 44.

## 2023-03-31 NOTE — Progress Notes (Signed)
03/31/2023 Gordon Allen   [birth year]  [id number]  Primary Physician Gordon Soho, PA-C Primary Cardiologist: Gordon Gess MD Gordon Allen, [place]  HPI:  Gordon Allen is a 57 y.o.   moderately overweight single Caucasian male patient of Gordon Soho, PA-C who was formally a patient of Dr. Pierre Allen. I last saw him in the hot office 09/19/2019.  He works as a Engineer, manufacturing at [employer].  He has a history of treated hypertension, diabetes and hyperlipidemia. He did have a right popliteal DVT back in 2013 about this on oral after graduation for 18 months which was then stopped. He apparently had a thrombophilia workup and was not told that he had any predisposition for this clotting. He had negative stress test back in 2009. He had a pneumonia treated approximately 2 months ago and about X and presented to [facility] over the summer with shortness of breath and dizziness. A chest CT was unrevealing. [place] and well until recently. He was complaining of some orthopnea and some swelling of his lower extremities. He saw his PCP who will order blood work including a d-dimer was minimally elevated. This led to a venous duplex study yesterday that showed right superficial femoral vein DVT. He has also been complaining of some lower extremity edema right greater than left which has since resolved. A 2-D echocardiogram performed 03/13/15 was essentially normal as was an echo performed 01/18/2017.  Myoview stress test performed 03/12/2015 was normal as well.   Since I saw him 3 and half years ago he has done well.  He did have anterior cervical fusion by Dr. Venetia Allen which is eventful.  6 to 8 weeks ago he had an episode of chest pain that he thinks was related to physical strain lasting approxiforty 8 hours relieved with Aleve.  He had no recurrent episodes.   Current Meds  Medication Sig   atorvastatin (LIPITOR) 20 MG tablet Take 20 mg by  mouth daily.   betamethasone dipropionate 0.05 % lotion Apply topically 2 (two) times daily.   Ciclopirox 1 % shampoo APPLY TO AFFECTED AREA EVERY DAY AS DIRECTED   Clobetasol Prop Emollient Base (CLOBETASOL PROPIONATE E) 0.05 % emollient cream Apply to rash once daily not for face or skin folds   FARXIGA 10 MG TABS tablet Take 10 mg by mouth daily.   glucose blood test strip OneTouch Verio test strips  USE AS DIRECTED TWICE DAILY   KETOCONAZOLE, TOPICAL, 1 % SHAM 1 application to scalp as needed   lisinopril (PRINIVIL,ZESTRIL) 20 MG tablet Take 20 mg by mouth 2 (two) times daily.   metFORMIN (GLUCOPHAGE) 1000 MG tablet Take 1,000 mg by mouth 2 (two) times daily with a meal.   methocarbamol (ROBAXIN) 500 MG tablet Take 1 tablet (500 mg total) by mouth 2 (two) times daily.   omeprazole (PRILOSEC) 40 MG capsule Take 1 capsule (40 mg total) by mouth daily.   OneTouch Delica Lancets 30G MISC OneTouch Delica Lancets 33 gauge  USE TO PRICK FINGER 3 TIMES DAILY   ONETOUCH VERIO test strip USE TO TEST BLOOD SUGAR 3 TIMES DAILY   oxymorphone (OPANA) 5 MG tablet Take 5 mg by mouth every 6 (six) hours as needed for pain.   polyethylene glycol (MIRALAX / GLYCOLAX) packet Take 17 g by mouth daily as needed (for constipation).   tamsulosin (FLOMAX) 0.4 MG CAPS capsule Take 0.4 mg by mouth.  Allergies  Allergen Reactions   Diclofenac Sodium Other (See Comments)    Increased gas   Gabapentin Other (See Comments)    Severe diarrhea   Oxycodone-Acetaminophen Nausea Only   Tramadol Nausea And Vomiting and Other (See Comments)    nausea    Social History   Socioeconomic History   Marital status: Single    Spouse name: Not on file   Number of children: 0   Years of education: Not on file   Highest education level: Not on file  Occupational History   Occupation: landscape  Tobacco Use   Smoking status: Never   Smokeless tobacco: Never  Vaping Use   Vaping status: Never Used  Substance and  Sexual Activity   Alcohol use: No   Drug use: No   Sexual activity: Not on file  Other Topics Concern   Not on file  Social History Narrative   Not on file   Social Determinants of Health   Financial Resource Strain: Not on file  Food Insecurity: Not on file  Transportation Needs: Not on file  Physical Activity: Not on file  Stress: Not on file  Social Connections: Not on file  Intimate Partner Violence: Not on file     Review of Systems: General: negative for chills, fever, night sweats or weight changes.  Cardiovascular: negative for chest pain, dyspnea on exertion, edema, orthopnea, palpitations, paroxysmal nocturnal dyspnea or shortness of breath Dermatological: negative for rash Respiratory: negative for cough or wheezing Urologic: negative for hematuria Abdominal: negative for nausea, vomiting, diarrhea, bright red blood per rectum, melena, or hematemesis Neurologic: negative for visual changes, syncope, or dizziness All other systems reviewed and are otherwise negative except as noted above.    Blood pressure 104/68, pulse 73, height 5\' 11"  (1.803 m), weight 217 lb 6.4 oz (98.6 kg), SpO2 96%.  General appearance: alert and no distress Neck: no adenopathy, no carotid bruit, no JVD, supple, symmetrical, trachea midline, and thyroid not enlarged, symmetric, no tenderness/mass/nodules Lungs: clear to auscultation bilaterally Heart: regular rate and rhythm, S1, S2 normal, no murmur, click, rub or gallop Extremities: extremities normal, atraumatic, no cyanosis or edema Pulses: 2+ and symmetric Skin: Skin color, texture, turgor normal. No rashes or lesions Neurologic: Grossly normal  EKG EKG Interpretation Date/Time:  Wednesday March 31 2023 13:57:35 EDT Ventricular Rate:  73 PR Interval:  162 QRS Duration:  114 QT Interval:  392 QTC Calculation: 431 R Axis:   -10  Text Interpretation: Normal sinus rhythm Incomplete left bundle branch block When compared with ECG of  06-Feb-2015 01:05, No significant change was found Confirmed by Gordon Allen [phone]) on 03/31/2023 2:02:51 PM    ASSESSMENT AND PLAN:   Essential hypertension History of essential hypertension blood pressure measured today at 104/68.  He is on lisinopril.  Hyperlipidemia History of hyperlipidemia on statin therapy with lipid profile performed 11/05/2022 revealing total cholesterol of 172, LDL 108 and HDL 44.  Atypical chest pain Patient had an episode of atypical chest pain 6 to 8 weeks ago lasting for about 48 hours.  He thinks it was related to physical activity and was relieved with Aleve.  I am going to get a coronary calcium score to her stratify.     Gordon Gess MD FACP,FACC,FAHA, [facility] 03/31/2023 2:18 PM

## 2023-03-31 NOTE — Assessment & Plan Note (Signed)
History of essential hypertension blood pressure measured today at 104/68.  He is on lisinopril.

## 2023-03-31 NOTE — Patient Instructions (Addendum)
Medication Instructions:  Your physician recommends that you continue on your current medications as directed. Please refer to the Current Medication list given to you today.  *If you need a refill on your cardiac medications before your next appointment, please call your pharmacy*   Testing/Procedures: Dr. Allyson Sabal has ordered a CT coronary calcium score.   Test locations:  [facility]  [facility]  This is $99 out of pocket.   Coronary CalciumScan A coronary calcium scan is an imaging test used to look for deposits of calcium and other fatty materials (plaques) in the inner lining of the blood vessels of the heart (coronary arteries). These deposits of calcium and plaques can partly clog and narrow the coronary arteries without producing any symptoms or warning signs. This puts a person at risk for a heart attack. This test can detect these deposits before symptoms develop. Tell a health care provider about: Any allergies you have. All medicines you are taking, including vitamins, herbs, eye drops, creams, and over-the-counter medicines. Any problems you or family members have had with anesthetic medicines. Any blood disorders you have. Any surgeries you have had. Any medical conditions you have. Whether you are pregnant or may be pregnant. What are the risks? Generally, this is a safe procedure. However, problems may occur, including: Harm to a pregnant woman and her unborn baby. This test involves the use of radiation. Radiation exposure can be dangerous to a pregnant woman and her unborn baby. If you are pregnant, you generally should not have this procedure done. Slight increase in the risk of cancer. This is because of the radiation involved in the test. What happens before the procedure? No preparation is needed for this procedure. What happens during the procedure? You will undress and  remove any jewelry around your neck or chest. You will put on a hospital gown. Sticky electrodes will be placed on your chest. The electrodes will be connected to an electrocardiogram (ECG) machine to record a tracing of the electrical activity of your heart. A CT scanner will take pictures of your heart. During this time, you will be asked to lie still and hold your breath for 2-3 seconds while a picture of your heart is being taken. The procedure may vary among health care providers and hospitals. What happens after the procedure? You can get dressed. You can return to your normal activities. It is up to you to get the results of your test. Ask your health care provider, or the department that is doing the test, when your results will be ready. Summary A coronary calcium scan is an imaging test used to look for deposits of calcium and other fatty materials (plaques) in the inner lining of the blood vessels of the heart (coronary arteries). Generally, this is a safe procedure. Tell your health care provider if you are pregnant or may be pregnant. No preparation is needed for this procedure. A CT scanner will take pictures of your heart. You can return to your normal activities after the scan is done. This information is not intended to replace advice given to you by your health care provider. Make sure you discuss any questions you have with your health care provider. Document Released: 02/13/2008 Document Revised: 07/06/2016 Document Reviewed: 07/06/2016 Elsevier Interactive Patient Education  2017 [employer].    Follow-Up: At [facility], you and your health needs are our priority.  As part of our continuing  mission to provide you with exceptional heart care, we have created designated Provider Care Teams.  These Care Teams include your primary Cardiologist (physician) and Advanced Practice Providers (APPs -  Physician Assistants and Nurse Practitioners) who all work together to  provide you with the care you need, when you need it.  We recommend signing up for the patient portal called "MyChart".  Sign up information is provided on this After Visit Summary.  MyChart is used to connect with patients for Virtual Visits (Telemedicine).  Patients are able to view lab/test results, encounter notes, upcoming appointments, etc.  Non-urgent messages can be sent to your provider as well.   To learn more about what you can do with MyChart, go to [email or web address].    Your next appointment:   12 month(s)  Provider:   Nanetta Batty, MD

## 2023-04-05 ENCOUNTER — Ambulatory Visit (HOSPITAL_BASED_OUTPATIENT_CLINIC_OR_DEPARTMENT_OTHER)
Admission: RE | Admit: 2023-04-05 | Discharge: 2023-04-05 | Disposition: A | Discharge status: Home / Self Care | Payer: BC Managed Care – PPO | Source: Ambulatory Visit | Attending: Cardiovascular Disease | Admitting: Cardiovascular Disease

## 2023-04-05 DIAGNOSIS — R0789 Other chest pain: Secondary | ICD-10-CM | POA: Insufficient documentation

## 2023-04-05 DIAGNOSIS — Z136 Encounter for screening for cardiovascular disorders: Secondary | ICD-10-CM | POA: Insufficient documentation

## 2023-04-05 DIAGNOSIS — E782 Mixed hyperlipidemia: Secondary | ICD-10-CM | POA: Insufficient documentation

## 2023-04-05 DIAGNOSIS — I10 Essential (primary) hypertension: Secondary | ICD-10-CM | POA: Insufficient documentation

## 2023-04-05 DIAGNOSIS — Z83511 Family history of glaucoma: Secondary | ICD-10-CM | POA: Diagnosis not present

## 2023-04-05 DIAGNOSIS — H2189 Other specified disorders of iris and ciliary body: Secondary | ICD-10-CM | POA: Diagnosis not present

## 2023-04-05 DIAGNOSIS — E113291 Type 2 diabetes mellitus with mild nonproliferative diabetic retinopathy without macular edema, right eye: Secondary | ICD-10-CM | POA: Diagnosis not present

## 2023-04-20 ENCOUNTER — Telehealth: Payer: Self-pay | Admitting: *Deleted

## 2023-04-20 NOTE — Telephone Encounter (Signed)
-----   Message from Nanetta Batty sent at 04/15/2023  3:59 PM EDT ----- LDL 113 on Atorva 20 mg. CCS 10. LDL goal < 70. Increase Atorva to 40 mg and re check FLP 3 months

## 2023-04-20 NOTE — Telephone Encounter (Signed)
Called left message for to call back .  Need to know if patient had any current  lipids drawn .    Lab results currently reviewed were from 03/20/22 will informed Dr Allyson Sabal once information is obtained.

## 2023-05-05 DIAGNOSIS — E1165 Type 2 diabetes mellitus with hyperglycemia: Secondary | ICD-10-CM | POA: Diagnosis not present

## 2023-05-11 DIAGNOSIS — N3943 Post-void dribbling: Secondary | ICD-10-CM | POA: Diagnosis not present

## 2023-05-11 DIAGNOSIS — R3915 Urgency of urination: Secondary | ICD-10-CM | POA: Diagnosis not present

## 2023-05-11 DIAGNOSIS — N401 Enlarged prostate with lower urinary tract symptoms: Secondary | ICD-10-CM | POA: Diagnosis not present

## 2023-05-11 DIAGNOSIS — N5201 Erectile dysfunction due to arterial insufficiency: Secondary | ICD-10-CM | POA: Diagnosis not present

## 2023-05-12 DIAGNOSIS — E785 Hyperlipidemia, unspecified: Secondary | ICD-10-CM | POA: Diagnosis not present

## 2023-05-12 DIAGNOSIS — E1165 Type 2 diabetes mellitus with hyperglycemia: Secondary | ICD-10-CM | POA: Diagnosis not present

## 2023-05-12 DIAGNOSIS — I10 Essential (primary) hypertension: Secondary | ICD-10-CM | POA: Diagnosis not present

## 2023-06-02 DIAGNOSIS — Z23 Encounter for immunization: Secondary | ICD-10-CM | POA: Diagnosis not present

## 2023-06-11 ENCOUNTER — Other Ambulatory Visit (HOSPITAL_BASED_OUTPATIENT_CLINIC_OR_DEPARTMENT_OTHER): Payer: Self-pay

## 2023-06-11 DIAGNOSIS — I10 Essential (primary) hypertension: Secondary | ICD-10-CM

## 2023-06-11 DIAGNOSIS — I77810 Thoracic aortic ectasia: Secondary | ICD-10-CM

## 2023-06-11 DIAGNOSIS — E782 Mixed hyperlipidemia: Secondary | ICD-10-CM

## 2023-07-06 DIAGNOSIS — E785 Hyperlipidemia, unspecified: Secondary | ICD-10-CM | POA: Diagnosis not present

## 2023-07-06 DIAGNOSIS — I10 Essential (primary) hypertension: Secondary | ICD-10-CM | POA: Diagnosis not present

## 2023-07-06 DIAGNOSIS — Z794 Long term (current) use of insulin: Secondary | ICD-10-CM | POA: Diagnosis not present

## 2023-07-06 DIAGNOSIS — E1165 Type 2 diabetes mellitus with hyperglycemia: Secondary | ICD-10-CM | POA: Diagnosis not present

## 2023-08-05 DIAGNOSIS — Z794 Long term (current) use of insulin: Secondary | ICD-10-CM | POA: Diagnosis not present

## 2023-08-05 DIAGNOSIS — E1165 Type 2 diabetes mellitus with hyperglycemia: Secondary | ICD-10-CM | POA: Diagnosis not present

## 2023-10-20 DIAGNOSIS — E1165 Type 2 diabetes mellitus with hyperglycemia: Secondary | ICD-10-CM | POA: Diagnosis not present

## 2023-10-27 DIAGNOSIS — E785 Hyperlipidemia, unspecified: Secondary | ICD-10-CM | POA: Diagnosis not present

## 2023-10-27 DIAGNOSIS — I10 Essential (primary) hypertension: Secondary | ICD-10-CM | POA: Diagnosis not present

## 2023-10-27 DIAGNOSIS — E1165 Type 2 diabetes mellitus with hyperglycemia: Secondary | ICD-10-CM | POA: Diagnosis not present

## 2023-10-27 DIAGNOSIS — Z794 Long term (current) use of insulin: Secondary | ICD-10-CM | POA: Diagnosis not present

## 2023-11-04 ENCOUNTER — Ambulatory Visit (INDEPENDENT_AMBULATORY_CARE_PROVIDER_SITE_OTHER)

## 2023-11-04 ENCOUNTER — Ambulatory Visit: Payer: BC Managed Care – PPO | Admitting: Podiatry

## 2023-11-04 ENCOUNTER — Encounter: Payer: Self-pay | Admitting: Podiatry

## 2023-11-04 DIAGNOSIS — Z86718 Personal history of other venous thrombosis and embolism: Secondary | ICD-10-CM | POA: Insufficient documentation

## 2023-11-04 DIAGNOSIS — R109 Unspecified abdominal pain: Secondary | ICD-10-CM | POA: Insufficient documentation

## 2023-11-04 DIAGNOSIS — M2041 Other hammer toe(s) (acquired), right foot: Secondary | ICD-10-CM

## 2023-11-04 DIAGNOSIS — I82409 Acute embolism and thrombosis of unspecified deep veins of unspecified lower extremity: Secondary | ICD-10-CM | POA: Insufficient documentation

## 2023-11-04 DIAGNOSIS — E8881 Metabolic syndrome: Secondary | ICD-10-CM | POA: Insufficient documentation

## 2023-11-04 DIAGNOSIS — Z6830 Body mass index (BMI) 30.0-30.9, adult: Secondary | ICD-10-CM | POA: Insufficient documentation

## 2023-11-04 DIAGNOSIS — H35039 Hypertensive retinopathy, unspecified eye: Secondary | ICD-10-CM | POA: Insufficient documentation

## 2023-11-04 DIAGNOSIS — K760 Fatty (change of) liver, not elsewhere classified: Secondary | ICD-10-CM | POA: Insufficient documentation

## 2023-11-04 DIAGNOSIS — L219 Seborrheic dermatitis, unspecified: Secondary | ICD-10-CM | POA: Insufficient documentation

## 2023-11-04 DIAGNOSIS — M19072 Primary osteoarthritis, left ankle and foot: Secondary | ICD-10-CM | POA: Diagnosis not present

## 2023-11-04 DIAGNOSIS — K219 Gastro-esophageal reflux disease without esophagitis: Secondary | ICD-10-CM | POA: Insufficient documentation

## 2023-11-04 DIAGNOSIS — E113291 Type 2 diabetes mellitus with mild nonproliferative diabetic retinopathy without macular edema, right eye: Secondary | ICD-10-CM | POA: Insufficient documentation

## 2023-11-04 DIAGNOSIS — D689 Coagulation defect, unspecified: Secondary | ICD-10-CM | POA: Insufficient documentation

## 2023-11-04 DIAGNOSIS — M2042 Other hammer toe(s) (acquired), left foot: Secondary | ICD-10-CM

## 2023-11-04 DIAGNOSIS — M7751 Other enthesopathy of right foot: Secondary | ICD-10-CM

## 2023-11-04 DIAGNOSIS — E1165 Type 2 diabetes mellitus with hyperglycemia: Secondary | ICD-10-CM | POA: Insufficient documentation

## 2023-11-04 DIAGNOSIS — N200 Calculus of kidney: Secondary | ICD-10-CM | POA: Insufficient documentation

## 2023-11-04 DIAGNOSIS — J329 Chronic sinusitis, unspecified: Secondary | ICD-10-CM | POA: Insufficient documentation

## 2023-11-04 DIAGNOSIS — J301 Allergic rhinitis due to pollen: Secondary | ICD-10-CM | POA: Insufficient documentation

## 2023-11-04 DIAGNOSIS — N529 Male erectile dysfunction, unspecified: Secondary | ICD-10-CM | POA: Insufficient documentation

## 2023-11-04 NOTE — Progress Notes (Signed)
 Subjective:  Patient ID: Gordon Allen, male    DOB: 08-05-1966,  MRN: [id number] HPI Chief Complaint  Patient presents with   Toe Pain    Hammertoe deformities bilateral - rubbing of toes, causing redness and possible wounds since diabetic - last A1c was 10.0, seeing [name] (PT) for years, made orthotics to try and help HT, triple arthrodesis left foot previously, tried fabric top shoes for less friction-they don't last long, 5th met base right tender if on feet for many hours, starting to notice knee and hip problems   New Patient (Initial Visit)    58 y.o. male presents with the above complaint.   ROS: Denies fever chills nausea vomiting muscle aches pains calf pain back pain chest pain shortness of breath.  Past Medical History:  Diagnosis Date   Abdominal aortic aneurysm (HCC)    Atypical mole 01/14/2017   Right Abdomen (Mild)   Back pain    Bilateral carpal tunnel syndrome 08/20/2020   Deep vein thrombosis (HCC)    Diabetes mellitus without complication (HCC)    Dizziness    DVT (deep venous thrombosis) (HCC)    ED (erectile dysfunction)    Fatty liver    GERD (gastroesophageal reflux disease)    Hypertension    Kidney stone    Metabolic disorder, unspecified    Seborrheic dermatitis    Shortness of breath    Sleep difficulties    Thoracic aortic aneurysm [facility])    Past Surgical History:  Procedure Laterality Date   abdominal aorta duplex  01/20/05   No evidence of aneurysmal dilatation. Evidence of aneurysmal dilatation.   ANKLE SURGERY     disk removed  20202   neck   DOPPLER ECHOCARDIOGRAPHY  07/09/2008, 01/20/2005   2009- Normal left ventricular size and function. Normal pattern of diastolic function. No hemodynamic significant valve disease. no evidence of pulmonary hypertension. No significant change compared to prior study from 01/20/2005.  2006- There is borderline concentric left ventricular hypertrophy. Left ventricular systolic function is  normal.   Holter moniter  12/29/2004   The predominant rhythm is Sinus Rhythm with periods of Sinus Tachycardia and Sinus Arrhythmis. Heart Rates: 139 bpm, Minimum: 46 bpm and Mean: 75 bpm.Aberrant Total: 4. Premature ventricular contractions. Atrial total: 25. No Ectopy   lower extremity venous doppler  11/26/2008,07/09/2008   Left greater saphenous vein reflux is identified with the caliber ranging from 0.70 cm to 0.46 cm knee to groin. the right and left greater saphenous veins are not aneurysmal. the right and left greater saphenous veins are not tortuous. The deep venous system is competent. The right and left lesser saphenous veins are competent.   2009-- No evidence of arterial insufficiency. Normal values.   NM MYOCAR PERF EJECTION FRACTION  06/13/2008   The post-stress ejection fraction is 58%. No significant ischemia demonstrated. This is a low risk scan. Normal myocardial perfusion imaging.    Current Outpatient Medications:    tadalafil (CIALIS) 5 MG tablet, Take 5 mg by mouth daily., Disp: , Rfl:    Tirzepatide (MOUNJARO) 5 MG/0.5ML SOPN, SMARTSIG:5 Milligram(s) SUB-Q Once a Week, Disp: , Rfl:    atorvastatin (LIPITOR) 20 MG tablet, Take 20 mg by mouth daily., Disp: , Rfl:    betamethasone dipropionate 0.05 % lotion, Apply topically 2 (two) times daily., Disp: 45 mL, Rfl: 3   Ciclopirox 1 % shampoo, APPLY TO AFFECTED AREA EVERY DAY AS DIRECTED, Disp: 120 mL, Rfl: 2   Clobetasol Prop Emollient Base (CLOBETASOL  PROPIONATE E) 0.05 % emollient cream, Apply to rash once daily not for face or skin folds, Disp: 45 g, Rfl: 1   FARXIGA 10 MG TABS tablet, Take 10 mg by mouth daily., Disp: , Rfl:    glucose blood test strip, OneTouch Verio test strips  USE AS DIRECTED TWICE DAILY, Disp: , Rfl:    KETOCONAZOLE, TOPICAL, 1 % SHAM, 1 application to scalp as needed, Disp: , Rfl:    lisinopril (PRINIVIL,ZESTRIL) 20 MG tablet, Take 20 mg by mouth 2 (two) times daily., Disp: , Rfl:    metFORMIN  (GLUCOPHAGE) 1000 MG tablet, Take 1,000 mg by mouth 2 (two) times daily with a meal., Disp: , Rfl:    methocarbamol (ROBAXIN) 500 MG tablet, Take 1 tablet (500 mg total) by mouth 2 (two) times daily., Disp: 20 tablet, Rfl: 0   NOVOLOG FLEXPEN 100 UNIT/ML FlexPen, SMARTSIG:10-14 Unit(s) SUB-Q 3 Times Daily, Disp: , Rfl:    omeprazole (PRILOSEC) 40 MG capsule, Take 1 capsule (40 mg total) by mouth daily., Disp: 30 capsule, Rfl: 3   OneTouch Delica Lancets 30G MISC, OneTouch Delica Lancets 33 gauge  USE TO PRICK FINGER 3 TIMES DAILY, Disp: , Rfl:    ONETOUCH VERIO test strip, USE TO TEST BLOOD SUGAR 3 TIMES DAILY, Disp: , Rfl:    oxymorphone (OPANA) 5 MG tablet, Take 5 mg by mouth every 6 (six) hours as needed for pain., Disp: , Rfl:    polyethylene glycol (MIRALAX / GLYCOLAX) packet, Take 17 g by mouth daily as needed (for constipation)., Disp: , Rfl:    tamsulosin (FLOMAX) 0.4 MG CAPS capsule, Take 0.4 mg by mouth., Disp: , Rfl:    TRESIBA FLEXTOUCH 100 UNIT/ML FlexTouch Pen, SMARTSIG:16 Unit(s) SUB-Q Twice Daily, Disp: , Rfl:   Allergies  Allergen Reactions   Diclofenac     Other Reaction(s): Not available  diclofenac   Diclofenac Sodium Other (See Comments)    Increased gas   Gabapentin Other (See Comments)    Severe diarrhea   Oxycodone-Acetaminophen Nausea Only   Tramadol Nausea And Vomiting and Other (See Comments)    nausea   Review of Systems Objective:  There were no vitals filed for this visit.  General: Well developed, nourished, in no acute distress, alert and oriented x3   Dermatological: Skin is warm, dry and supple bilateral. Nails x 10 are well maintained; remaining integument appears unremarkable at this time. There are no open sores, no preulcerative lesions, no rash or signs of infection present.  Vascular: Dorsalis Pedis artery and Posterior Tibial artery pedal pulses are 2/4 bilateral with immedate capillary fill time. Pedal hair growth present. No varicosities and  no lower extremity edema present bilateral.   Neruologic: Grossly intact via light touch bilateral. Vibratory intact via tuning fork bilateral. Protective threshold with Semmes Wienstein monofilament intact to all pedal sites bilateral. Patellar and Achilles deep tendon reflexes 2+ bilateral. No Babinski or clonus noted bilateral.   Musculoskeletal: No gross boney pedal deformities bilateral. No pain, crepitus, or limitation noted with foot and ankle range of motion bilateral. Muscular strength 5/5 in all groups tested bilateral.  Flexible hammertoe deformities noted bilateral right appears to be worse than the left and also has some pain on palpation of the fifth metatarsal base of the right foot.  No skin breakdown but flexible hammertoes and with his altered ambulation state feels that is affecting the ability for his knee and hips to perform normally.  Gait: Unassisted, Nonantalgic.    Radiographs:  Radiographs taken today demonstrate osseously mature foot triple arthrodesis the rear foot right.  Hammertoe deformities weightbearing minimal but nonweightbearing are considerably contracted.  This does not demonstrate a cavus foot.  Assessment & Plan:   Assessment: Hammertoe deformity painful fifth metatarsal base right foot hammertoe deformities left some early osteoarthritic changes second PIPJ bilateral.  Plan: Discussed etiology pathology conservative surgical therapies at this point I think trying to find him an orthotic is going to be paramount.  We definitely need an orthotic physical to have a pocket for the fifth metatarsal and not overcorrect him to a lateral position.  We did discuss need for surgical correction of his toes he understands and is amenable to it would like to have that done once he gets his hemoglobin A1c under control.  We will try to do both feet at that same time.  He will follow-up with Bethann Berkshire in the near future for orthotics I did request that he bring his  orthotics and discussed with her how the current orthotics make his feet feel.     Peggi Yono T. [place], [place]

## 2023-12-10 ENCOUNTER — Ambulatory Visit

## 2023-12-10 NOTE — Progress Notes (Signed)
 Orthotics   Patient was present and evaluated for Custom molded foot orthotics. Patient will benefit from CFO's to provide total contact to BIL MLA's helping to balance and distribute body weight more evenly across BIL feet helping to reduce plantar pressure and pain. Orthotic will also encourage FF / RF alignment  Patient was scanned today and will return for fitting upon receipt

## 2024-01-26 DIAGNOSIS — E1165 Type 2 diabetes mellitus with hyperglycemia: Secondary | ICD-10-CM | POA: Diagnosis not present

## 2024-01-31 DIAGNOSIS — E1165 Type 2 diabetes mellitus with hyperglycemia: Secondary | ICD-10-CM | POA: Diagnosis not present

## 2024-01-31 DIAGNOSIS — Z794 Long term (current) use of insulin: Secondary | ICD-10-CM | POA: Diagnosis not present

## 2024-01-31 DIAGNOSIS — I10 Essential (primary) hypertension: Secondary | ICD-10-CM | POA: Diagnosis not present

## 2024-02-02 ENCOUNTER — Other Ambulatory Visit: Payer: Self-pay

## 2024-02-02 ENCOUNTER — Ambulatory Visit (INDEPENDENT_AMBULATORY_CARE_PROVIDER_SITE_OTHER): Admitting: Podiatry

## 2024-02-02 DIAGNOSIS — M7751 Other enthesopathy of right foot: Secondary | ICD-10-CM

## 2024-02-02 DIAGNOSIS — M2042 Other hammer toe(s) (acquired), left foot: Secondary | ICD-10-CM

## 2024-02-02 DIAGNOSIS — M2041 Other hammer toe(s) (acquired), right foot: Secondary | ICD-10-CM

## 2024-02-02 NOTE — Progress Notes (Signed)
 Orhotitcs were not dispensed. There was suppose to be cutout of the fifth metatarsal base bilateral

## 2024-03-09 ENCOUNTER — Ambulatory Visit (INDEPENDENT_AMBULATORY_CARE_PROVIDER_SITE_OTHER)

## 2024-03-09 DIAGNOSIS — M2142 Flat foot [pes planus] (acquired), left foot: Secondary | ICD-10-CM | POA: Diagnosis not present

## 2024-03-09 DIAGNOSIS — M2041 Other hammer toe(s) (acquired), right foot: Secondary | ICD-10-CM

## 2024-03-09 DIAGNOSIS — M2042 Other hammer toe(s) (acquired), left foot: Secondary | ICD-10-CM

## 2024-03-09 DIAGNOSIS — M2141 Flat foot [pes planus] (acquired), right foot: Secondary | ICD-10-CM | POA: Diagnosis not present

## 2024-03-21 NOTE — Progress Notes (Signed)
 Patient presents today to pick up custom molded foot orthotics, diagnosed with hammertoes DM2 by Dr. Verta.   Orthotics were dispensed and fit was satisfactory. Reviewed instructions for break-in and wear. Written instructions given to patient.  Patient will follow up as needed.  Lolita Schultze CPed, CFo, CFm

## 2024-04-04 DIAGNOSIS — E1165 Type 2 diabetes mellitus with hyperglycemia: Secondary | ICD-10-CM | POA: Diagnosis not present

## 2024-04-04 DIAGNOSIS — I10 Essential (primary) hypertension: Secondary | ICD-10-CM | POA: Diagnosis not present

## 2024-04-04 DIAGNOSIS — E785 Hyperlipidemia, unspecified: Secondary | ICD-10-CM | POA: Diagnosis not present

## 2024-04-04 DIAGNOSIS — K760 Fatty (change of) liver, not elsewhere classified: Secondary | ICD-10-CM | POA: Diagnosis not present

## 2024-04-04 DIAGNOSIS — E538 Deficiency of other specified B group vitamins: Secondary | ICD-10-CM | POA: Diagnosis not present

## 2024-04-04 DIAGNOSIS — Z0000 Encounter for general adult medical examination without abnormal findings: Secondary | ICD-10-CM | POA: Diagnosis not present

## 2024-04-10 DIAGNOSIS — Z83511 Family history of glaucoma: Secondary | ICD-10-CM | POA: Diagnosis not present

## 2024-04-10 DIAGNOSIS — H35033 Hypertensive retinopathy, bilateral: Secondary | ICD-10-CM | POA: Diagnosis not present

## 2024-04-10 DIAGNOSIS — E113291 Type 2 diabetes mellitus with mild nonproliferative diabetic retinopathy without macular edema, right eye: Secondary | ICD-10-CM | POA: Diagnosis not present

## 2024-04-10 DIAGNOSIS — H2189 Other specified disorders of iris and ciliary body: Secondary | ICD-10-CM | POA: Diagnosis not present

## 2024-04-26 DIAGNOSIS — E1165 Type 2 diabetes mellitus with hyperglycemia: Secondary | ICD-10-CM | POA: Diagnosis not present

## 2024-04-28 ENCOUNTER — Ambulatory Visit (HOSPITAL_COMMUNITY)
Admission: RE | Admit: 2024-04-28 | Discharge: 2024-04-28 | Disposition: A | Discharge status: Home / Self Care | Source: Ambulatory Visit | Attending: Cardiology | Admitting: Cardiology

## 2024-04-28 DIAGNOSIS — E782 Mixed hyperlipidemia: Secondary | ICD-10-CM | POA: Insufficient documentation

## 2024-04-28 DIAGNOSIS — I77810 Thoracic aortic ectasia: Secondary | ICD-10-CM | POA: Diagnosis not present

## 2024-04-28 DIAGNOSIS — R0602 Shortness of breath: Secondary | ICD-10-CM

## 2024-04-28 DIAGNOSIS — I10 Essential (primary) hypertension: Secondary | ICD-10-CM | POA: Insufficient documentation

## 2024-04-28 LAB — ECHOCARDIOGRAM COMPLETE
Area-P 1/2: 3.88 cm2
S' Lateral: 2.5 cm

## 2024-04-30 ENCOUNTER — Ambulatory Visit: Payer: Self-pay | Admitting: Cardiovascular Disease

## 2024-05-04 DIAGNOSIS — L219 Seborrheic dermatitis, unspecified: Secondary | ICD-10-CM | POA: Diagnosis not present

## 2024-05-08 DIAGNOSIS — E1165 Type 2 diabetes mellitus with hyperglycemia: Secondary | ICD-10-CM | POA: Diagnosis not present

## 2024-05-08 DIAGNOSIS — M2041 Other hammer toe(s) (acquired), right foot: Secondary | ICD-10-CM | POA: Diagnosis not present

## 2024-05-08 DIAGNOSIS — I10 Essential (primary) hypertension: Secondary | ICD-10-CM | POA: Diagnosis not present

## 2024-05-08 DIAGNOSIS — Z794 Long term (current) use of insulin: Secondary | ICD-10-CM | POA: Diagnosis not present

## 2024-05-16 ENCOUNTER — Encounter: Payer: Self-pay | Admitting: Cardiovascular Disease

## 2024-05-16 ENCOUNTER — Ambulatory Visit: Attending: Cardiovascular Disease | Admitting: Cardiovascular Disease

## 2024-05-16 VITALS — BP 124/70 | HR 67 | Ht 71.0 in | Wt 211.0 lb

## 2024-05-16 DIAGNOSIS — E782 Mixed hyperlipidemia: Secondary | ICD-10-CM | POA: Diagnosis not present

## 2024-05-16 DIAGNOSIS — I77810 Thoracic aortic ectasia: Secondary | ICD-10-CM | POA: Diagnosis not present

## 2024-05-16 DIAGNOSIS — R931 Abnormal findings on diagnostic imaging of heart and coronary circulation: Secondary | ICD-10-CM | POA: Diagnosis not present

## 2024-05-16 DIAGNOSIS — I10 Essential (primary) hypertension: Secondary | ICD-10-CM | POA: Diagnosis not present

## 2024-05-16 MED ORDER — ATORVASTATIN CALCIUM 40 MG PO TABS: 40.0000 mg | ORAL_TABLET | Freq: Every day | ORAL | 3 refills | Status: AC

## 2024-05-16 NOTE — Assessment & Plan Note (Signed)
 Coronary calcium  score performed 04/05/2023 was 10 distributed in the LAD and RCA.  He is asymptomatic.  He is not quite at goal for secondary prevention.  I am going to adjust his statin dosage appropriately.

## 2024-05-16 NOTE — Assessment & Plan Note (Signed)
 History of essential hypertension blood pressure measured today 124/70.  He is on lisinopril.

## 2024-05-16 NOTE — Assessment & Plan Note (Signed)
 History of hyperlipidemia on atorvastatin  20 mg a day with lipid profile performed 04/04/2024 revealing total cholesterol 147, LDL of 80 and HDL of 38.  Given his mildly elevated coronary calcium  score I am going to increase his atorvastatin  from 20 to 40 mg a day and we will recheck a lipid liver profile in 3 months.  LDL goal less than 70

## 2024-05-16 NOTE — Progress Notes (Signed)
 05/16/2024 Newell Wafer Sangiovanni   [birth year]  [id number]  Primary Physician Katina Pfeiffer, PA-C Primary Cardiologist: Dorn JINNY Lesches MD [facility] [place], [place]  HPI:  Gordon Allen is a 58 y.o.    moderately overweight single Caucasian male patient of Pfeiffer Katina, PA-C who was formally a patient of Dr. Zell Bureau. I last saw him in the office 03/31/2023.  He works as a Engineer, manufacturing at [employer].  He has a history of treated hypertension, diabetes and hyperlipidemia. He did have a right popliteal DVT back in 2013 about this on oral after graduation for 18 months which was then stopped. He apparently had a thrombophilia workup and was not told that he had any predisposition for this clotting. He had negative stress test back in 2009. He had a pneumonia treated approximately 2 months ago and about X and presented to [facility] over the summer with shortness of breath and dizziness. A chest CT was unrevealing. [place] and well until recently. He was complaining of some orthopnea and some swelling of his lower extremities. He saw his PCP who will order blood work including a d-dimer was minimally elevated. This led to a venous duplex study yesterday that showed right superficial femoral vein DVT. He has also been complaining of some lower extremity edema right greater than left which has since resolved. A 2-D echocardiogram performed 03/13/15 was essentially normal as was an echo performed 01/18/2017.  Myoview stress test performed 03/12/2015 was normal as well.   Since I saw him a year ago he is done well.  He has lost weight as a result of diet and exercise.  He says he feels short of breath when he bends down and gets up and does get some dyspnea on exertion but denies chest pain.  He had a 2D echocardiogram performed 04/28/2024 which was essentially normal and a coronary calcium  score of 10 performed 04/08/2023.   Current Meds  Medication Sig    FARXIGA 10 MG TABS tablet Take 10 mg by mouth daily.   glucose blood test strip OneTouch Verio test strips  USE AS DIRECTED TWICE DAILY   KETOCONAZOLE, TOPICAL, 1 % SHAM 1 application to scalp as needed   lisinopril (PRINIVIL,ZESTRIL) 20 MG tablet Take 20 mg by mouth 2 (two) times daily.   metFORMIN (GLUCOPHAGE) 1000 MG tablet Take 1,000 mg by mouth 2 (two) times daily with a meal.   NOVOLOG FLEXPEN 100 UNIT/ML FlexPen SMARTSIG:10-14 Unit(s) SUB-Q 3 Times Daily   omeprazole  (PRILOSEC) 40 MG capsule Take 1 capsule (40 mg total) by mouth daily.   OneTouch Delica Lancets 30G MISC OneTouch Delica Lancets 33 gauge  USE TO PRICK FINGER 3 TIMES DAILY   ONETOUCH VERIO test strip USE TO TEST BLOOD SUGAR 3 TIMES DAILY   oxymorphone (OPANA) 5 MG tablet Take 5 mg by mouth every 6 (six) hours as needed for pain.   polyethylene glycol (MIRALAX / GLYCOLAX) packet Take 17 g by mouth daily as needed (for constipation).   tadalafil (CIALIS) 5 MG tablet Take 5 mg by mouth daily.   tamsulosin (FLOMAX) 0.4 MG CAPS capsule Take 0.4 mg by mouth.   Tirzepatide (MOUNJARO) 5 MG/0.5ML SOPN SMARTSIG:5 Milligram(s) SUB-Q Once a Week   TRESIBA FLEXTOUCH 100 UNIT/ML FlexTouch Pen SMARTSIG:16 Unit(s) SUB-Q Twice Daily     Allergies  Allergen Reactions   Diclofenac     Other Reaction(s): Not available  diclofenac  Diclofenac Sodium Other (See Comments)    Increased gas   Gabapentin Other (See Comments)    Severe diarrhea   Oxycodone-Acetaminophen  Nausea Only   Tramadol Nausea And Vomiting and Other (See Comments)    nausea    Social History   Socioeconomic History   Marital status: Single    Spouse name: Not on file   Number of children: 0   Years of education: Not on file   Highest education level: Not on file  Occupational History   Occupation: landscape  Tobacco Use   Smoking status: Never   Smokeless tobacco: Never  Vaping Use   Vaping status: Never Used  Substance and Sexual Activity    Alcohol use: No   Drug use: No   Sexual activity: Not on file  Other Topics Concern   Not on file  Social History Narrative   Not on file   Social Drivers of Health   Financial Resource Strain: Not on file  Food Insecurity: Not on file  Transportation Needs: Not on file  Physical Activity: Not on file  Stress: Not on file  Social Connections: Not on file  Intimate Partner Violence: Not on file     Review of Systems: General: negative for chills, fever, night sweats or weight changes.  Cardiovascular: negative for chest pain, dyspnea on exertion, edema, orthopnea, palpitations, paroxysmal nocturnal dyspnea or shortness of breath Dermatological: negative for rash Respiratory: negative for cough or wheezing Urologic: negative for hematuria Abdominal: negative for nausea, vomiting, diarrhea, bright red blood per rectum, melena, or hematemesis Neurologic: negative for visual changes, syncope, or dizziness All other systems reviewed and are otherwise negative except as noted above.    Blood pressure 124/70, pulse 67, height 5' 11 (1.803 m), weight 211 lb (95.7 kg), SpO2 97%.  General appearance: alert and no distress Neck: no adenopathy, no carotid bruit, no JVD, supple, symmetrical, trachea midline, and thyroid  not enlarged, symmetric, no tenderness/mass/nodules Lungs: clear to auscultation bilaterally Heart: regular rate and rhythm, S1, S2 normal, no murmur, click, rub or gallop Extremities: extremities normal, atraumatic, no cyanosis or edema Pulses: 2+ and symmetric Skin: Skin color, texture, turgor normal. No rashes or lesions Neurologic: Grossly normal  EKG EKG Interpretation Date/Time:  Tuesday May 16 2024 14:58:26 EDT Ventricular Rate:  67 PR Interval:  176 QRS Duration:  106 QT Interval:  396 QTC Calculation: 418 R Axis:   -5  Text Interpretation: Normal sinus rhythm Normal ECG When compared with ECG of 31-Mar-2023 13:57, No significant change was found  Confirmed by Court Carrier [phone]) on 05/16/2024 3:16:44 PM    ASSESSMENT AND PLAN:   Essential hypertension History of essential hypertension blood pressure measured today 124/70.  He is on lisinopril.  Hyperlipidemia History of hyperlipidemia on atorvastatin  20 mg a day with lipid profile performed 04/04/2024 revealing total cholesterol 147, LDL of 80 and HDL of 38.  Given his mildly elevated coronary calcium  score I am going to increase his atorvastatin  from 20 to 40 mg a day and we will recheck a lipid liver profile in 3 months.  LDL goal less than 70  Elevated coronary artery calcium  score Coronary calcium  score performed 04/05/2023 was 10 distributed in the LAD and RCA.  He is asymptomatic.  He is not quite at goal for secondary prevention.  I am going to adjust his statin dosage appropriately.     Carrier DOROTHA Court MD FACP,FACC,FAHA, [facility] 05/16/2024 3:35 PM

## 2024-05-16 NOTE — Patient Instructions (Addendum)
 Medication Instructions:  Your physician has recommended you make the following change in your medication:   -Increase atorvastatin  (lipitor) to 40mg  once daily.  *If you need a refill on your cardiac medications before your next appointment, please call your pharmacy*  Lab Work: Your physician recommends that you return for lab work in: 3 months for FASTING lipid/liver panel  If you have labs (blood work) drawn today and your tests are completely normal, you will receive your results only by: MyChart Message (if you have MyChart) OR A paper copy in the mail If you have any lab test that is abnormal or we need to change your treatment, we will call you to review the results.  Testing/Procedures: Your physician has requested that you have an echocardiogram. Echocardiography is a painless test that uses sound waves to create images of your heart. It provides your doctor with information about the size and shape of your heart and how well your heart's chambers and valves are working. This procedure takes approximately one hour. There are no restrictions for this procedure. Please do NOT wear cologne, perfume, aftershave, or lotions (deodorant is allowed). Please arrive 15 minutes prior to your appointment time.  Please note: We ask at that you not bring children with you during ultrasound (echo/ vascular) testing. Due to room size and safety concerns, children are not allowed in the ultrasound rooms during exams. Our front office staff cannot provide observation of children in our lobby area while testing is being conducted. An adult accompanying a patient to their appointment will only be allowed in the ultrasound room at the discretion of the ultrasound technician under special circumstances. We apologize for any inconvenience.    Your physician has requested that you have an abdominal aorta duplex. During this test, an ultrasound is used to evaluate the aorta. Allow 30 minutes for this exam.  Do not eat after midnight the day before and avoid carbonated beverages. This will take place at [address], 4th floor.  Please note: We ask at that you not bring children with you during ultrasound (echo/ vascular) testing. Due to room size and safety concerns, children are not allowed in the ultrasound rooms during exams. Our front office staff cannot provide observation of children in our lobby area while testing is being conducted. An adult accompanying a patient to their appointment will only be allowed in the ultrasound room at the discretion of the ultrasound technician under special circumstances. We apologize for any inconvenience.   Follow-Up: At [facility], you and your health needs are our priority.  As part of our continuing mission to provide you with exceptional heart care, our providers are all part of one team.  This team includes your primary Cardiologist (physician) and Advanced Practice Providers or APPs (Physician Assistants and Nurse Practitioners) who all work together to provide you with the care you need, when you need it.  Your next appointment:   12 month(s)  Provider:   Dorn Lesches, MD    We recommend signing up for the patient portal called MyChart.  Sign up information is provided on this After Visit Summary.  MyChart is used to connect with patients for Virtual Visits (Telemedicine).  Patients are able to view lab/test results, encounter notes, upcoming appointments, etc.  Non-urgent messages can be sent to your provider as well.   To learn more about what you can do with MyChart, go to [email or web address].

## 2024-05-29 ENCOUNTER — Ambulatory Visit

## 2024-05-30 NOTE — Progress Notes (Signed)
 Remaking again with offloads to 1,2 and 5 bilateral Met pad instead of MT bar  Patient will be notified for fitting when in  [place]

## 2024-06-02 ENCOUNTER — Ambulatory Visit (HOSPITAL_COMMUNITY)
Admission: RE | Admit: 2024-06-02 | Discharge: 2024-06-02 | Disposition: A | Discharge status: Home / Self Care | Source: Ambulatory Visit | Attending: Cardiovascular Disease | Admitting: Cardiovascular Disease

## 2024-06-02 ENCOUNTER — Ambulatory Visit: Payer: Self-pay | Admitting: Cardiovascular Disease

## 2024-06-02 DIAGNOSIS — I10 Essential (primary) hypertension: Secondary | ICD-10-CM | POA: Diagnosis not present

## 2024-06-07 DIAGNOSIS — Z23 Encounter for immunization: Secondary | ICD-10-CM | POA: Diagnosis not present

## 2024-06-19 ENCOUNTER — Ambulatory Visit

## 2024-06-19 NOTE — Progress Notes (Signed)
 Patient recvd remade orthotics and will try reports that they feel good and will let us  know if he has any other issue Lolita Schultze Cped, CFo, CFm

## 2024-07-11 DIAGNOSIS — E1165 Type 2 diabetes mellitus with hyperglycemia: Secondary | ICD-10-CM | POA: Diagnosis not present

## 2025-04-09 ENCOUNTER — Other Ambulatory Visit (HOSPITAL_COMMUNITY)
